# Patient Record
Sex: Male | Born: 1980 | Race: White | Hispanic: No | Marital: Married | Smoking: Current some day smoker
Health system: Southern US, Community
[De-identification: ages and names within clinical notes are randomized; demographics above are authoritative.]

## PROBLEM LIST (undated history)

## (undated) DIAGNOSIS — M549 Dorsalgia, unspecified: Secondary | ICD-10-CM

## (undated) HISTORY — PX: LUMBAR SPINE SURGERY: SHX701

---

## 2000-08-16 ENCOUNTER — Emergency Department (HOSPITAL_COMMUNITY): Admission: EM | Admit: 2000-08-16 | Discharge: 2000-08-16 | Payer: Self-pay | Admitting: Emergency Medicine

## 2006-08-16 ENCOUNTER — Emergency Department: Payer: Self-pay

## 2007-10-21 ENCOUNTER — Emergency Department: Payer: Self-pay | Admitting: Emergency Medicine

## 2007-12-10 ENCOUNTER — Emergency Department: Payer: Self-pay | Admitting: Emergency Medicine

## 2007-12-15 ENCOUNTER — Ambulatory Visit: Payer: Self-pay | Admitting: Family Medicine

## 2008-09-22 ENCOUNTER — Emergency Department (HOSPITAL_COMMUNITY): Admission: EM | Admit: 2008-09-22 | Discharge: 2008-09-22 | Payer: Self-pay | Admitting: Emergency Medicine

## 2008-09-24 ENCOUNTER — Emergency Department (HOSPITAL_COMMUNITY): Admission: EM | Admit: 2008-09-24 | Discharge: 2008-09-24 | Payer: Self-pay | Admitting: *Deleted

## 2008-09-25 ENCOUNTER — Ambulatory Visit (HOSPITAL_COMMUNITY): Admission: RE | Admit: 2008-09-25 | Discharge: 2008-09-25 | Payer: Self-pay | Admitting: Specialist

## 2008-10-04 ENCOUNTER — Inpatient Hospital Stay (HOSPITAL_COMMUNITY): Admission: RE | Admit: 2008-10-04 | Discharge: 2008-10-06 | Payer: Self-pay | Admitting: Specialist

## 2008-10-04 ENCOUNTER — Encounter (INDEPENDENT_AMBULATORY_CARE_PROVIDER_SITE_OTHER): Payer: Self-pay | Admitting: Specialist

## 2008-10-31 ENCOUNTER — Emergency Department (HOSPITAL_COMMUNITY): Admission: EM | Admit: 2008-10-31 | Discharge: 2008-11-01 | Payer: Self-pay | Admitting: Emergency Medicine

## 2008-11-28 ENCOUNTER — Emergency Department: Payer: Self-pay | Admitting: Emergency Medicine

## 2008-12-05 ENCOUNTER — Emergency Department (HOSPITAL_COMMUNITY): Admission: EM | Admit: 2008-12-05 | Discharge: 2008-12-06 | Payer: Self-pay | Admitting: Emergency Medicine

## 2009-04-21 ENCOUNTER — Emergency Department (HOSPITAL_COMMUNITY): Admission: EM | Admit: 2009-04-21 | Discharge: 2009-04-21 | Payer: Self-pay | Admitting: Emergency Medicine

## 2009-05-04 ENCOUNTER — Emergency Department: Payer: Self-pay | Admitting: Emergency Medicine

## 2009-05-09 ENCOUNTER — Emergency Department: Payer: Self-pay | Admitting: Emergency Medicine

## 2009-05-10 ENCOUNTER — Emergency Department (HOSPITAL_COMMUNITY): Admission: EM | Admit: 2009-05-10 | Discharge: 2009-05-10 | Payer: Self-pay | Admitting: Emergency Medicine

## 2009-11-14 ENCOUNTER — Emergency Department (HOSPITAL_COMMUNITY): Admission: EM | Admit: 2009-11-14 | Discharge: 2009-11-14 | Payer: Self-pay | Admitting: Emergency Medicine

## 2009-11-28 ENCOUNTER — Emergency Department (HOSPITAL_COMMUNITY): Admission: EM | Admit: 2009-11-28 | Discharge: 2009-11-28 | Payer: Self-pay | Admitting: Emergency Medicine

## 2009-12-01 ENCOUNTER — Emergency Department: Payer: Self-pay | Admitting: Emergency Medicine

## 2009-12-27 ENCOUNTER — Emergency Department: Payer: Self-pay | Admitting: Emergency Medicine

## 2010-01-04 ENCOUNTER — Emergency Department: Payer: Self-pay | Admitting: Emergency Medicine

## 2010-01-23 ENCOUNTER — Emergency Department: Payer: Self-pay | Admitting: Emergency Medicine

## 2010-01-23 ENCOUNTER — Emergency Department (HOSPITAL_COMMUNITY): Admission: EM | Admit: 2010-01-23 | Discharge: 2010-01-23 | Payer: Self-pay | Admitting: Emergency Medicine

## 2010-01-25 ENCOUNTER — Emergency Department: Payer: Self-pay | Admitting: Emergency Medicine

## 2010-02-02 ENCOUNTER — Emergency Department: Payer: Self-pay | Admitting: Emergency Medicine

## 2010-04-26 ENCOUNTER — Emergency Department (HOSPITAL_COMMUNITY): Admission: EM | Admit: 2010-04-26 | Discharge: 2010-04-26 | Payer: Self-pay | Admitting: Emergency Medicine

## 2010-05-04 ENCOUNTER — Emergency Department: Payer: Self-pay | Admitting: Emergency Medicine

## 2010-06-30 ENCOUNTER — Emergency Department: Payer: Self-pay | Admitting: Emergency Medicine

## 2010-10-10 LAB — APTT: aPTT: 36 seconds (ref 24–37)

## 2010-10-10 LAB — CBC: Hemoglobin: 14.4 g/dL (ref 13.0–17.0)

## 2010-10-10 LAB — DIFFERENTIAL
Basophils Absolute: 0 10*3/uL (ref 0.0–0.1)
Eosinophils Absolute: 0.2 10*3/uL (ref 0.0–0.7)
Eosinophils Relative: 4 % (ref 0–5)
Monocytes Absolute: 0.5 10*3/uL (ref 0.1–1.0)
Neutro Abs: 2.2 10*3/uL (ref 1.7–7.7)

## 2010-10-10 LAB — BASIC METABOLIC PANEL
BUN: 11 mg/dL (ref 6–23)
CO2: 29 mEq/L (ref 19–32)
Creatinine, Ser: 0.84 mg/dL (ref 0.4–1.5)
GFR calc Af Amer: >60 mL/min (ref >60)

## 2010-10-10 LAB — PROTIME-INR: INR: 0.9 (ref 0.00–1.49)

## 2010-11-13 NOTE — Op Note (Signed)
NAMEDELVONTE, BERENSON                ACCOUNT NO.:  [id number]   MEDICAL RECORD NO.:  [id number]          PATIENT TYPE:  INP   LOCATION:  5001                         FACILITY:  [facility]   PHYSICIAN:  Kerrin Champagne, M.D.   DATE OF BIRTH:  Feb 04, 1981   DATE OF PROCEDURE:  10/04/2008  DATE OF DISCHARGE:                               OPERATIVE REPORT   PREOPERATIVE DIAGNOSIS:  Herniated nucleus pulposus, central L4-5 and  herniated nucleus pulposus, central right-sided L5-S1.   POSTOPERATIVE DIAGNOSIS:  Herniated nucleus pulposus, central L4-5 and  herniated nucleus pulposus, central right-sided L5-S1.   PROCEDURE:  Bilateral approach to microdiskectomy, L4-5 and L5-S1 with  excision of herniated nucleus pulposus primarily right-sided at L4-5 and  L5-S1.   SURGEON:  Kerrin Champagne, MD   ASSISTANT:  Wende Neighbors, PA-C   ANESTHESIA:  General via orotracheal intubation.   FINDINGS:  Large HNP, L4-5 centrally, thecal sac and nerve roots were  lifted posteriorly over the posterior aspect of spinal canal.  At the L5-  S1 midline HNP with extension to the right side, epidural veins  tethering the S1 nerve root over the disk protrusion.   Foley catheter was discontinued at the end of the case.   ESTIMATED BLOOD LOSS:  50 mL.   COMPLICATIONS:  None.   The patient was sent to the PACU in good condition.   HISTORY OF PRESENT ILLNESS:  The patient is a 30 year old male with  history of low back pain going on for years, more recently worsening to  the point, he was having difficulty standing for any length of time or  walking for any length of time.  The patient was seen in the office last  week after being seen in the emergency room 1 week prior for severe back  pain, radiation to the both legs and buttocks.  Underwent MRI study.  Results of the MRI study indicates central disk herniations of L4-5 and  L5-S1.  The largest disk protrusions at L4-5 with severe compression of  the thecal  sac and nerve roots at this level.  The patient's clinical  exam showed severe sciatic tension signs x3.  He describes his pain as  being constant, requiring narcotic medicines to relieve discomfort in  any position as it was sitting, lying, or standing.  He was brought to  the operating room to undergo microdiskectomy, bilateral approach to the  L4-5 level, decompression bilaterally if necessary.  His pain is right  sided of the leg, so that approach through the right side will be  performed first and if unable to fully decompress, then left-sided disk  excision would also be performed.   DESCRIPTION OF PROCEDURE:  This patient was seen in preoperative holding  area and has had areas for expected surgery marked along the right side,  L4-5, L5-S1.  In the operating room, he was placed under general  anesthetic, had Foley catheter placed under preoperative antibiotics of  Ancef.  Roll to a prone position, a Wilson frame was used, Academic librarian.  All pressure points were well padded, PAS  stockings.   Initial incision approximately 4.5 cm in length after first placing  spinal needles and then performing lateral radiograph in the operating  room demonstrating the needles on either side of the L5 spinous process.  Incision was made between the needles after infiltration of Marcaine  0.5% with 1:200,000 epinephrine.  Incision approximately 4.5 cm in  length through the skin and subcutaneous layers down to the lumbodorsal  fascia, this was then incised on either side of spinous process.  The  supraspinous ligaments continued along the lateral aspects of the  spinous processes of L4, L5, S1, and of L3.  Clamps were placed at the  expected L4-5 and L5-S1 interspinous processes.  Intraoperative lateral  radiographs demonstrated the lower clamp at the L4-5 level, the upper at  the L5-S1 level so that further exposure was continued downwards, an  additional 2 cm.  Cobbs used to elevate the  paralumbar muscles off the  posterior elements and this was continued laterally.  Bleeders  controlled using electrocautery.  McCulloch retractor was then inserted.  Each of the areas were clamped in place, were marked with a single  suture.  The single suture at the lowest level indicating the space  between this L4 and L5 spinous processes of the next interspinous space  was L5-S1 inferiorly.  This was verified with the identification of the  sacrum.  McCulloch retractor was then adjusted and Leksell rongeur was  used to remove the small portion of the inferior aspect of lamina  bilaterally at L4 and bilaterally at L5.  High-speed bur was then used  to remove further small portion of the inferior aspect of lamina at L4  bilaterally and L5 bilaterally, performed partial medial fasciectomy  involving the medial aspect of the inferior articular process of L4  bilaterally and L5 bilaterally about 10-15% of the joint.  Curette freed  the ligamentum flavum off the superior aspect of the lamina of S1 and L5  bilaterally and resection of the inferior aspect of the lamina  bilaterally at the L4 and L5 levels carried out up to the insertion of  ligamentum flavum where this was then detached using a nerve hook and 2  or 3 cm Kerrison.  Ligamentum flavum was resected bilaterally at the L4-  5 level, L5-S1 this was done using loop magnification.  Bleeders  controlled using bipolar electrocautery.  Operating room microscope was  sterilely draped and brought into the field.  Under the operating room  microscope, L4-5 level was evaluated.  Disk herniation was large in the  central portion of the disk, so that the thecal sac and the spinal nerve  roots were displaced posteriorly.  Disk herniation extended the lateral  recesses, the disk portion of the lateral recess up to just beyond the  posterior aspect of the superior articular process of L5 bilaterally.  The disk that was to be excised on the right  side, foraminotomy was  first performed over both L5 nerve roots freeing these areas up nicely.  Attention turned to the right side where the thecal sac was carefully  retracted medially, the disk herniation identified and a nerve hook,  then pituitaries were then used to carefully tease large amount of disk  material from the central herniation site and 3 large fragments were  removed, the first fragment about 5 or 6 inches in length x 0.5 inch x  0.25 inch, these were readily enough disk herniation material to explain  the mass effect seen on  the MRI study.  Following this, then the  posterior longitudinal ligaments was noted to be quite floppy and was  left intact.  Epstein curette was used to carefully debride further  annular material along the right side and micro pituitaries as well as  regular pituitaries were used excise any of this annular material that  was debrided.  Disk space entered and further disk material was excised,  it was loose and fragmented using the micro straight pituitaries.  The  reflected portion of ligamentum flavum was resected bilaterally at L4  and L5.  Neuroforamen for the L4 nerve roots were probed using Kellogg neuro probe, probe free bilaterally, as did the foramen for the L5  nerve roots. Thrombin-soaked Gelfoam was then placed over the laminotomy  sites bilaterally and attention turned to the L5-S1 level.  Note that  examination was carried out from the left side, the side opposite of the  disk excision and the L5 nerve root retracted medially as was the thecal  sac and the disk posteriorly examined.  Disk was found to be somewhat  patulous and showed no further disk herniation present in the  subligamentous area and it was felt that this posterior longitudinal  ligament is somewhat loose due to decompression of underlying disk here.  No further disk material was found at this point.  Small amount of  epidural veins were cauterized using bipolar  electrocautery.  Thrombin-  soaked Gelfoam placed on the left as well as right side of L4-5, and  again, attention turned to the next segment at L5-S1.  Here on the right  side, the thecal sac and L5 nerve root were carefully freed up,  retracting medially.  Penfield 4 used the tease the epidural veins  medially identifying the L5-S1 disk space and protruded disk area.  A 15  blade scalpel then used to incise the disk using operative microscope  longitudinally and pituitary rongeurs used to debride the disk of  degenerative disk material.  A bulbous-tip nerve hook then used to probe  the disk space.  Epstein curettes were then used to further debride the  annular material posterior to the posterior aspect of the disk space.  This was then excised using pituitary rongeurs until no further  fragments were found to be free within the disk area.  Hockey stick  neuro probe passed out both neural foramina, both right and left side  indicating decompression of the S1 nerve roots on both sides.  The right  S1 nerve root was carefully freed up further by cauterizing epidural  veins as a leash keeping the neuroforamen being able to retract  medially.  These were divided with 15 blade scalpel.  Following  hemostasis and irrigation, all excess Gelfoam was then removed in its  entirety through midline with interrupted #1 Vicryl sutures.  Deep subcu  layers reapproximated with interrupted #1 and 0 Vicryl sutures.  More  superficial layers with interrupted 2-0 Vicryl sutures and skin closed  with a running subcu stitch of 4-0 Vicryl.  The patient then had  Dermabond applied.  A 4 x 4's fixed to the skin with Hypafix tape.  The  patient then returned to the supine position.  He had Foley catheter  removed.  He was reactivated, extubated, and returned to recovery room  in satisfactory condition.      Kerrin Champagne, M.D.  Electronically Signed     JEN/MEDQ  D:  10/04/2008  T:  10/05/2008  Job:  [id number] 

## 2010-11-16 NOTE — Discharge Summary (Signed)
Alexander Morgan, Alexander Morgan                ACCOUNT NO.:  [id number]   MEDICAL RECORD NO.:  [id number]          PATIENT TYPE:  INP   LOCATION:  5001                         FACILITY:  [facility]   PHYSICIAN:  Kerrin Champagne, M.D.   DATE OF BIRTH:  June 02, 1981   DATE OF ADMISSION:  10/04/2008  DATE OF DISCHARGE:  10/06/2008                               DISCHARGE SUMMARY   ADMISSION DIAGNOSES:  1. Herniated nucleus pulposus, central L4-L5 and right-sided L5-S1.  2. Hypertension.  3. Depression.   DISCHARGE DIAGNOSES:  1. Herniated nucleus pulposus, central L4-L5 and right-sided L5-S1.  2. Hypertension.  3. Depression.   PROCEDURE:  On October 04, 2008, the patient underwent bilateral approach  to microdiskectomy at L4-L5 and L5-S1 with excision of herniated nucleus  pulposus, primarily right-sided at L4-L5 and L5-S1.  This was performed  by Dr. Otelia Sergeant assisted by Maud Deed, [facility] under general anesthesia.   CONSULTATIONS:  None.   BRIEF HISTORY:  The patient is a 30 year old male with increasing pain  in his low back with difficulty standing for periods of time and  walking.  He has required emergency room visits previously for severe  back pain radiating into both legs and buttocks.  MRI study indicated  central disk herniations at L4-L5 and L5-S1.  The largest disk  protrusion was at the L4-L5 level with severe compression of the thecal  sac and nerve roots.  Clinical exam showed severe sciatic tension signs  x3.  He is requiring narcotic analgesics for his discomfort.  It was  felt that he would benefit from surgical intervention and was admitted  for the procedure as stated above.   BRIEF HOSPITAL COURSE:  The patient tolerated the procedure under  general anesthesia without complications.  Postoperatively,  neurovascular motor function of the lower extremities was intact.  The  patient had considerable discomfort and required increasing of his  analgesics.  He had difficulty with  independent ambulation and therefore  physical therapy and occupational therapy consults were obtained and he  was able to ambulate short distances in the hallway.  The patient also  had mild headache on the first postoperative day.  Neurovascular and  motor function of the lower extremities was intact.  He was afebrile and  vital signs were stable.  The patient was able to void without  difficulty.  On October 06, 2008, he was able to be discharged to his home.  Dressing change showed the wound to be healing without drainage.   PERTINENT LABORATORY VALUES:  Admission labs include CBC, coagulation  study, and BMET which all were within normal limits.   PLAN:  The patient was discharged to home with instructions to change  his dressing daily and given Tegaderm dressings to do so.  He will  continue ambulating as tolerated.  He will avoid bending, lifting, or  twisting.  The patient is instructed to follow up with Dr. Otelia Sergeant 2 weeks  from the date of surgery.  Arrangements will be made by phoning [phone].   DISCHARGE MEDICATIONS:  1. OxyContin 20 mg one q.12 h.  2. Percocet 5/325 one to two every 4-6 hours as needed for pain.  3. Robaxin 500 mg one every 8 hours as needed for spasm.  4. Lisinopril 20 mg daily.  5. Hydrochlorothiazide 25 mg daily.   All questions encouraged and answered.   CONDITION ON DISCHARGE:  Stable.      Wende Neighbors, P.A.      Kerrin Champagne, M.D.  Electronically Signed    SMV/MEDQ  D:  11/17/2008  T:  11/18/2008  Job:  [id number]

## 2011-01-18 ENCOUNTER — Emergency Department (HOSPITAL_COMMUNITY)
Admission: EM | Admit: 2011-01-18 | Discharge: 2011-01-18 | Disposition: A | Discharge status: Home / Self Care | Payer: Self-pay | Attending: Emergency Medicine | Admitting: Emergency Medicine

## 2011-01-18 DIAGNOSIS — M545 Low back pain, unspecified: Secondary | ICD-10-CM | POA: Insufficient documentation

## 2011-01-18 HISTORY — DX: Dorsalgia, unspecified: M54.9

## 2011-01-18 MED ORDER — OXYCODONE-ACETAMINOPHEN 10-325 MG PO TABS: 1.0000 | ORAL_TABLET | ORAL | Status: DC | PRN

## 2011-01-18 MED ORDER — KETOROLAC TROMETHAMINE 60 MG/2ML IM SOLN
60.0000 mg | Freq: Once | INTRAMUSCULAR | Status: AC
  Administered 2011-01-18: 60 mg via INTRAMUSCULAR
  Filled 2011-01-18: qty 2

## 2011-01-18 MED ORDER — MORPHINE SULFATE 4 MG/ML IJ SOLN: 4.0000 mg | Freq: Once | INTRAMUSCULAR | Status: DC

## 2011-01-18 MED ORDER — MORPHINE SULFATE 10 MG/ML IJ SOLN
10.0000 mg | Freq: Once | INTRAMUSCULAR | Status: AC
  Administered 2011-01-18: 10 mg via INTRAMUSCULAR
  Filled 2011-01-18: qty 1

## 2011-01-18 NOTE — ED Notes (Signed)
Pt presents with low back pain. Pt has hx of ruptured disc in L4&L5. Pt due to have surgery but is out of pain medication. PMD instructed pt to come to ED for pain medication"to get him though" per pt.

## 2011-01-18 NOTE — ED Notes (Signed)
Pt reports damage to his L4 & L5.  States that he has already had surgery once but is scheduled to have another soon.  Pt states that his dr gave him 87 percocet to help with the pain before surgery but he has ran out.  He has an appt set for Wednesday, but states that he cannot make it that long.  nad noted

## 2011-01-18 NOTE — ED Provider Notes (Signed)
History     Chief Complaint  Patient presents with  . Back Pain   HPI Comments: Alexander Morgan 30 y.o. White male with history of chronic back problems. He has had surgery previously for lower back and approx. 3 weeks ago had severe back pain and he saw his orthopedic surgeon and had MRI that showed ruptured disc at L4-5. He is scheduled for surgery in 3 weeks but here today with pain that is not controlled with his current medication. States he is having severe pain in the left side of his back going down his left leg with some numbness.       The history is provided by the patient.    Past Medical History  Diagnosis Date  . Back pain     Past Surgical History  Procedure Date  . Lumbar spine surgery     History reviewed. No pertinent family history.  History  Substance Use Topics  . Smoking status: Never Smoker   . Smokeless tobacco: Not on file  . Alcohol Use: No      Review of Systems  Constitutional: Negative for fever, chills, diaphoresis and fatigue.  HENT: Negative for ear pain, congestion, sore throat, facial swelling, neck pain, neck stiffness, dental problem and sinus pressure.   Eyes: Negative for photophobia, pain and discharge.  Respiratory: Negative for cough, chest tightness and wheezing.   Gastrointestinal: Negative for nausea, vomiting, abdominal pain, diarrhea, constipation and abdominal distention.  Genitourinary: Negative for dysuria, frequency, flank pain and difficulty urinating.  Musculoskeletal: Positive for back pain and gait problem. Negative for myalgias.  Skin: Negative for color change and rash.  Neurological: Positive for weakness and numbness. Negative for dizziness, speech difficulty, light-headedness and headaches.  Psychiatric/Behavioral: Negative for confusion and agitation.    Physical Exam  BP 127/81  Pulse 108  Temp(Src) 97.7 F (36.5 C) (Oral)  Resp 16  SpO2 99%  Physical Exam  Nursing note and vitals  reviewed. Constitutional: He is oriented to person, place, and time. He appears well-developed and well-nourished.  HENT:  Head: Normocephalic and atraumatic.  Neck: Neck supple.  Pulmonary/Chest: Effort normal. No respiratory distress.  Abdominal: Soft. There is no tenderness.  Musculoskeletal: He exhibits no edema.       Lumbar back: He exhibits decreased range of motion, tenderness, bony tenderness and spasm.       Pain with straight leg raises, more on left than right  Neurological: He is alert and oriented to person, place, and time. He has normal reflexes. No cranial nerve deficit.  Skin: Skin is warm and dry. No bruising, no ecchymosis and no rash noted.  Psychiatric: He has a normal mood and affect.    ED Course  Procedures  MDM ABDULHAMID OLGIN has has a history chronic back pain and scheduled for surgery.  Dx of ruptured disc L 4 -5. Scheduled for surgery. Will treat for pain until he can follow up with Dr. Otelia Sergeant.        [place], [place] 01/18/11 [phone]

## 2011-01-18 NOTE — ED Notes (Signed)
No reaction from injections.

## 2011-01-18 NOTE — ED Notes (Signed)
PA at bedside.

## 2011-01-19 NOTE — ED Provider Notes (Signed)
Medical screening examination/treatment/procedure(s) were performed by non-physician practitioner and as supervising physician I was immediately available for consultation/collaboration. Devoria Albe, MD, FACEP   Ward Givens, MD 01/19/11 [phone]

## 2011-04-10 ENCOUNTER — Emergency Department (HOSPITAL_COMMUNITY)
Admission: EM | Admit: 2011-04-10 | Discharge: 2011-04-10 | Disposition: A | Discharge status: Home / Self Care | Payer: Self-pay | Attending: Emergency Medicine | Admitting: Emergency Medicine

## 2011-04-10 DIAGNOSIS — R209 Unspecified disturbances of skin sensation: Secondary | ICD-10-CM | POA: Insufficient documentation

## 2011-04-10 DIAGNOSIS — M545 Low back pain, unspecified: Secondary | ICD-10-CM | POA: Insufficient documentation

## 2011-04-10 DIAGNOSIS — R292 Abnormal reflex: Secondary | ICD-10-CM | POA: Insufficient documentation

## 2011-04-10 DIAGNOSIS — Z79899 Other long term (current) drug therapy: Secondary | ICD-10-CM | POA: Insufficient documentation

## 2011-04-10 DIAGNOSIS — G8929 Other chronic pain: Secondary | ICD-10-CM | POA: Insufficient documentation

## 2011-04-10 DIAGNOSIS — I10 Essential (primary) hypertension: Secondary | ICD-10-CM | POA: Insufficient documentation

## 2011-04-26 ENCOUNTER — Emergency Department (HOSPITAL_COMMUNITY)
Admission: EM | Admit: 2011-04-26 | Discharge: 2011-04-26 | Discharge status: Against Medical Advice | Payer: Self-pay | Attending: Emergency Medicine | Admitting: Emergency Medicine

## 2011-04-26 ENCOUNTER — Encounter (HOSPITAL_COMMUNITY): Payer: Self-pay

## 2011-04-26 DIAGNOSIS — M549 Dorsalgia, unspecified: Secondary | ICD-10-CM | POA: Insufficient documentation

## 2011-04-26 DIAGNOSIS — Z5320 Procedure and treatment not carried out because of patient's decision for unspecified reasons: Secondary | ICD-10-CM | POA: Insufficient documentation

## 2011-04-26 NOTE — ED Notes (Signed)
Pt states that he has chronic back problems, states that he has progressively gotten worse now to the point that the pain affects his adl's at home, pt denies any new injury, states that he contacted his neurosurgeon today who advised pt to come to local er due to not having enough money to see dr today, pt states that he is waiting for his medicaid to get "come through" in a week or two.

## 2011-04-26 NOTE — ED Notes (Signed)
Pt states that once he takes the car to his aunt that he will return to er for tx.

## 2011-04-26 NOTE — ED Notes (Signed)
Pt presents with low back pain. Pt states he has ruptured discs in back and does not have insurance to get surgery that is needed. Pt states pain is increasing and is unable to put shoes on. Pt states he has had pain for over 3 years. Pt to triage via w/c.

## 2011-04-26 NOTE — ED Notes (Signed)
Pt states that he is not able to wait anylonger, pt states that they borrowed a car to bring them to the er and that they have to have the car back by three today, asked pt if he could stay and have someone pick him up pt states that no he could not. Ama risks explained to pt and family, ama signed by pt

## 2011-05-09 NOTE — ED Provider Notes (Signed)
History     CSN: [id number] Arrival date & time: 04/26/2011 12:22 PM   First MD Initiated Contact with Patient 04/26/11 1432      Chief Complaint  Patient presents with  . Back Pain    (Consider location/radiation/quality/duration/timing/severity/associated sxs/prior treatment) HPI  Past Medical History  Diagnosis Date  . Back pain     Past Surgical History  Procedure Date  . Lumbar spine surgery     History reviewed. No pertinent family history.  History  Substance Use Topics  . Smoking status: Never Smoker   . Smokeless tobacco: Not on file  . Alcohol Use: No      Review of Systems  Allergies  Review of patient's allergies indicates no known allergies.  Home Medications   Current Outpatient Rx  Name Route Sig Dispense Refill  . DIAZEPAM 5 MG PO TABS Oral Take 10 mg by mouth at bedtime as needed.      Marland Kitchen LISINOPRIL-HYDROCHLOROTHIAZIDE 20-12.5 MG PO TABS Oral Take 1 tablet by mouth daily.      . OXYCODONE-ACETAMINOPHEN 10-325 MG PO TABS Oral Take 1 tablet by mouth every 4 (four) hours as needed. 30 tablet 0    BP 144/87  Pulse 86  Temp(Src) 98.3 F (36.8 C) (Oral)  Resp 20  Ht 5\' 7"  (1.702 m)  Wt 220 lb (99.791 kg)  BMI 34.46 kg/m2  SpO2 100%  Physical Exam  ED Course  Procedures (including critical care time)  Labs Reviewed - No data to display No results found.   No diagnosis found.    MDM          Worthy Rancher, PA 05/09/11 [phone]

## 2011-05-10 NOTE — ED Provider Notes (Signed)
Medical screening examination/treatment/procedure(s) were performed by non-physician practitioner and as supervising physician I was immediately available for consultation/collaboration.   Phillis Thackeray M Madelin Weseman, DO 05/10/11 1201 

## 2012-01-28 ENCOUNTER — Encounter (HOSPITAL_COMMUNITY): Payer: Self-pay | Admitting: *Deleted

## 2012-01-28 ENCOUNTER — Emergency Department (HOSPITAL_COMMUNITY): Payer: Self-pay

## 2012-01-28 ENCOUNTER — Emergency Department (HOSPITAL_COMMUNITY)
Admission: EM | Admit: 2012-01-28 | Discharge: 2012-01-28 | Disposition: A | Discharge status: Home / Self Care | Payer: Self-pay | Attending: Emergency Medicine | Admitting: Emergency Medicine

## 2012-01-28 DIAGNOSIS — R109 Unspecified abdominal pain: Secondary | ICD-10-CM | POA: Insufficient documentation

## 2012-01-28 DIAGNOSIS — N200 Calculus of kidney: Secondary | ICD-10-CM | POA: Insufficient documentation

## 2012-01-28 LAB — COMPREHENSIVE METABOLIC PANEL
Albumin: 5 g/dL (ref 3.5–5.2)
Alkaline Phosphatase: 95 U/L (ref 39–117)
Chloride: 102 mEq/L (ref 96–112)
GFR calc Af Amer: >90 mL/min (ref >90)
GFR calc non Af Amer: >90 mL/min (ref >90)
Potassium: 3.8 mEq/L (ref 3.5–5.1)
Sodium: 140 mEq/L (ref 135–145)
Total Bilirubin: 0.7 mg/dL (ref 0.3–1.2)
Total Protein: 8.6 g/dL — ABNORMAL HIGH (ref 6.0–8.3)

## 2012-01-28 LAB — CBC WITH DIFFERENTIAL/PLATELET
Basophils Absolute: 0 10*3/uL (ref 0.0–0.1)
Basophils Relative: 1 % (ref 0–1)
Eosinophils Absolute: 0.2 10*3/uL (ref 0.0–0.7)
Lymphs Abs: 2.8 10*3/uL (ref 0.7–4.0)
Monocytes Absolute: 0.7 10*3/uL (ref 0.1–1.0)
Monocytes Relative: 10 % (ref 3–12)
Neutro Abs: 3.5 10*3/uL (ref 1.7–7.7)
Neutrophils Relative %: 48 % (ref 43–77)
RBC: 5.25 MIL/uL (ref 4.22–5.81)
RDW: 13.4 % (ref 11.5–15.5)
WBC: 7.4 10*3/uL (ref 4.0–10.5)

## 2012-01-28 MED ORDER — ONDANSETRON HCL 4 MG/2ML IJ SOLN
4.0000 mg | Freq: Once | INTRAMUSCULAR | Status: AC
  Administered 2012-01-28: 4 mg via INTRAVENOUS
  Filled 2012-01-28: qty 2

## 2012-01-28 MED ORDER — IOHEXOL 300 MG/ML  SOLN
100.0000 mL | Freq: Once | INTRAMUSCULAR | Status: AC | PRN
  Administered 2012-01-28: 100 mL via INTRAVENOUS

## 2012-01-28 MED ORDER — HYDROCODONE-ACETAMINOPHEN 5-325 MG PO TABS: 1.0000 | ORAL_TABLET | Freq: Four times a day (QID) | ORAL | Status: AC | PRN

## 2012-01-28 MED ORDER — HYDROMORPHONE HCL PF 1 MG/ML IJ SOLN
1.0000 mg | Freq: Once | INTRAMUSCULAR | Status: AC
  Administered 2012-01-28: 1 mg via INTRAVENOUS
  Filled 2012-01-28: qty 1

## 2012-01-28 NOTE — ED Notes (Signed)
Discharge instructions reviewed with pt, questions answered. Pt verbalized understanding.  

## 2012-01-28 NOTE — ED Notes (Signed)
Pt back from CT
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
## 2013-05-18 ENCOUNTER — Emergency Department (HOSPITAL_COMMUNITY)
Admission: EM | Admit: 2013-05-18 | Discharge: 2013-05-18 | Disposition: A | Discharge status: Home / Self Care | Payer: Self-pay | Attending: Emergency Medicine | Admitting: Emergency Medicine

## 2013-05-18 ENCOUNTER — Encounter (HOSPITAL_COMMUNITY): Payer: Self-pay | Admitting: Emergency Medicine

## 2013-05-18 DIAGNOSIS — G8929 Other chronic pain: Secondary | ICD-10-CM | POA: Insufficient documentation

## 2013-05-18 DIAGNOSIS — M545 Low back pain, unspecified: Secondary | ICD-10-CM | POA: Insufficient documentation

## 2013-05-18 DIAGNOSIS — M549 Dorsalgia, unspecified: Secondary | ICD-10-CM

## 2013-05-18 DIAGNOSIS — Z9889 Other specified postprocedural states: Secondary | ICD-10-CM | POA: Insufficient documentation

## 2013-05-18 DIAGNOSIS — Z79899 Other long term (current) drug therapy: Secondary | ICD-10-CM | POA: Insufficient documentation

## 2013-05-18 MED ORDER — METHOCARBAMOL 500 MG PO TABS: 1000.0000 mg | ORAL_TABLET | Freq: Four times a day (QID) | ORAL | Status: DC

## 2013-05-18 MED ORDER — OXYCODONE-ACETAMINOPHEN 5-325 MG PO TABS: 1.0000 | ORAL_TABLET | Freq: Four times a day (QID) | ORAL | Status: DC | PRN
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
## 2013-05-18 NOTE — ED Provider Notes (Signed)
CSN: [id number]     Arrival date & time 05/18/13  1122 History  This chart was scribed for Renne Crigler, PA working with Shelda Jakes, MD by Quintella Reichert, ED Scribe. This patient was seen in room TR07C/TR07C and the patient's care was started at 11:36 AM.   Chief Complaint  Patient presents with  . Back Pain    The history is provided by the patient and medical records. No language interpreter was used.    HPI Comments: Alexander Morgan is a 32 y.o. male who presents to the Emergency Department complaining of exacerbation of his chronic back pain.  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  He states he had been doing well until the past several weeks during which his pain has been flaring up again.  He notes that he has been chopping wood recently.  Presently he complains of severe lower left-sided back pain that radiates down the back of his left leg.  He has been taking Goody Powder without relief.  He normally takes oxycodone and Valium which is effective for him, but he has been out for 2 months because he has not seen his pain management specialist recently.  He has an appointment on 11/28 to do this as well as to discuss another surgery if needed.  Pt denies bowel or bladder issues, fevers, unexpected weight loss.  He denies IV drug use.  According to [facility] Substance Reporting Database, last narcotics prescription filled on 04/28/13.  He received 120 tablets Percocet 10-325 at that time.   Past Medical History  Diagnosis Date  . Back pain     Past Surgical History  Procedure Laterality Date  . Lumbar spine surgery      No family history on file.   History  Substance Use Topics  . Smoking status: Never Smoker   . Smokeless tobacco: Not on file  . Alcohol Use: Yes     Review of Systems  Constitutional: Negative for fever and unexpected weight change.  Gastrointestinal: Negative for diarrhea and constipation.       Neg for fecal  incontinence  Genitourinary: Negative for dysuria, urgency, frequency, hematuria, flank pain, decreased urine volume and difficulty urinating.       Negative for urinary incontinence or retention  Musculoskeletal: Positive for back pain.  Neurological: Negative for weakness and numbness.       Negative for saddle paresthesias      Allergies  Mobic and Naproxen  Home Medications   Current Outpatient Rx  Name  Route  Sig  Dispense  Refill  . Aspirin-Acetaminophen 500-325 MG PACK   Oral   Take 3 packets by mouth 3 (three) times daily as needed (pain).         . methocarbamol (ROBAXIN) 500 MG tablet   Oral   Take 2 tablets (1,000 mg total) by mouth 4 (four) times daily.   20 tablet   0   . oxyCODONE-acetaminophen (PERCOCET/ROXICET) 5-325 MG per tablet   Oral   Take 1-2 tablets by mouth every 6 (six) hours as needed for severe pain.   8 tablet   0     BP 135/90  Pulse 91  Resp 20  SpO2 100%  Physical Exam  Nursing note and vitals reviewed. Constitutional: He appears well-developed and well-nourished. No distress.  HENT:  Head: Normocephalic and atraumatic.  Eyes: Conjunctivae and EOM are normal.  Neck: Normal range of motion. Neck supple. No tracheal  deviation present.  Cardiovascular: Normal rate.   Pulmonary/Chest: Effort normal. No respiratory distress.  Abdominal: Soft. There is no tenderness. There is no CVA tenderness.  Musculoskeletal: Normal range of motion. He exhibits no tenderness.  Left lumbar paraspinous tenderness and spasm  Neurological: He is alert. He has normal reflexes. No sensory deficit. He exhibits normal muscle tone.  5/5 strength in entire lower extremities bilaterally. No sensation deficit.   Skin: Skin is warm and dry.  Psychiatric: He has a normal mood and affect. His behavior is normal.    ED Course  Procedures (including critical care time)  DIAGNOSTIC STUDIES: Oxygen Saturation is 100% on room air, normal by my interpretation.     COORDINATION OF CARE: 11:48 AM-Discussed treatment plan which includes pain medication and muscle relaxants and f/u with specialist with pt at bedside and pt agreed to plan.    Vital signs reviewed and are as follows: Filed Vitals:   05/18/13 1157  BP:   Pulse:   Temp: 98.6 F (37 C)  Resp:   BP 135/90  Pulse 91  Temp(Src) 98.6 F (37 C) (Oral)  Resp 20  SpO2 100%   No red flag s/s of low back pain. Patient was counseled on back pain precautions and told to do activity as tolerated but do not lift, push, or pull heavy objects more than 10 pounds for the next week.  Patient counseled to use ice or heat on back for no longer than 15 minutes every hour.   Patient prescribed muscle relaxer and counseled on proper use of muscle relaxant medication.    Patient prescribed narcotic pain medicine and counseled on proper use of narcotic pain medications. Counseled not to combine this medication with others containing tylenol.   Urged patient not to drink alcohol, drive, or perform any other activities that requires focus while taking either of these medications.  Patient urged to follow-up with PCP if pain does not improve with treatment and rest or if pain becomes recurrent. Urged to return with worsening severe pain, loss of bowel or bladder control, trouble walking.   The patient verbalizes understanding and agrees with the plan.      Labs Review Labs Reviewed - No data to display  Imaging Review No results found.  EKG Interpretation   None       MDM   1. Back pain    Patient with back pain. Suspect he is misusing his medications. He should still have percocet from last rx. No neurological deficits. Patient is ambulatory. No warning symptoms of back pain including: loss of bowel or bladder control, night sweats, waking from sleep with back pain, unexplained fevers or weight loss, h/o cancer, IVDU, recent trauma. No concern for cauda equina, epidural abscess, or other  serious cause of back pain. Conservative measures such as rest, ice/heat and pain medicine indicated with PCP follow-up if no improvement with conservative management.      I personally performed the services described in this documentation, which was scribed in my presence. The recorded information has been reviewed and is accurate.    Renne Crigler, PA-C 05/18/13 1205

## 2013-05-22 NOTE — ED Provider Notes (Signed)
Medical screening examination/treatment/procedure(s) were performed by non-physician practitioner and as supervising physician I was immediately available for consultation/collaboration.  EKG Interpretation   None        Ruey Storer W. Raziya Aveni, MD 05/22/13 1356 

## 2015-01-05 ENCOUNTER — Emergency Department (HOSPITAL_COMMUNITY): Payer: Self-pay

## 2015-01-05 ENCOUNTER — Encounter (HOSPITAL_COMMUNITY): Payer: Self-pay | Admitting: Emergency Medicine

## 2015-01-05 ENCOUNTER — Emergency Department (HOSPITAL_COMMUNITY)
Admission: EM | Admit: 2015-01-05 | Discharge: 2015-01-05 | Disposition: A | Discharge status: Home / Self Care | Payer: Self-pay | Attending: Emergency Medicine | Admitting: Emergency Medicine

## 2015-01-05 DIAGNOSIS — I10 Essential (primary) hypertension: Secondary | ICD-10-CM | POA: Insufficient documentation

## 2015-01-05 DIAGNOSIS — Z79899 Other long term (current) drug therapy: Secondary | ICD-10-CM | POA: Insufficient documentation

## 2015-01-05 DIAGNOSIS — W01198A Fall on same level from slipping, tripping and stumbling with subsequent striking against other object, initial encounter: Secondary | ICD-10-CM | POA: Insufficient documentation

## 2015-01-05 DIAGNOSIS — Z87891 Personal history of nicotine dependence: Secondary | ICD-10-CM | POA: Insufficient documentation

## 2015-01-05 DIAGNOSIS — S20229A Contusion of unspecified back wall of thorax, initial encounter: Secondary | ICD-10-CM | POA: Insufficient documentation

## 2015-01-05 DIAGNOSIS — Y998 Other external cause status: Secondary | ICD-10-CM | POA: Insufficient documentation

## 2015-01-05 DIAGNOSIS — Y9389 Activity, other specified: Secondary | ICD-10-CM | POA: Insufficient documentation

## 2015-01-05 DIAGNOSIS — Y9289 Other specified places as the place of occurrence of the external cause: Secondary | ICD-10-CM | POA: Insufficient documentation

## 2015-01-05 MED ORDER — PREDNISONE 10 MG PO TABS: ORAL_TABLET | ORAL | Status: DC

## 2015-01-05 MED ORDER — HYDROCODONE-ACETAMINOPHEN 5-325 MG PO TABS: 2.0000 | ORAL_TABLET | ORAL | Status: DC | PRN

## 2015-01-05 MED ORDER — METHOCARBAMOL 500 MG PO TABS: 500.0000 mg | ORAL_TABLET | Freq: Four times a day (QID) | ORAL | Status: DC | PRN

## 2015-01-05 MED ORDER — OXYCODONE-ACETAMINOPHEN 5-325 MG PO TABS
2.0000 | ORAL_TABLET | Freq: Once | ORAL | Status: AC
  Administered 2015-01-05: 2 via ORAL
  Filled 2015-01-05: qty 2

## 2015-01-05 NOTE — ED Notes (Signed)
Was carrying some shingles up a ladder yesterday evening , his sneakers were wet and slipped on rung , fell backwards about 5 ft landed on rt lower back-

## 2015-01-05 NOTE — ED Provider Notes (Signed)
CSN: [id number]     Arrival date & time 01/05/15  1152 History   First MD Initiated Contact with Patient 01/05/15 1310     Chief Complaint  Patient presents with  . Back Pain     (Consider location/radiation/quality/duration/timing/severity/associated sxs/prior Treatment) Patient is a 34 y.o. male presenting with back pain. The history is provided by the patient. No language interpreter was used.  Back Pain Location:  Generalized Quality:  Aching Radiates to:  Does not radiate Pain severity:  Moderate Pain is:  Same all the time Onset quality:  Sudden Duration:  1 day Timing:  Constant Progression:  Worsening Chronicity:  New Relieved by:  Nothing Worsened by:  Nothing tried Ineffective treatments:  None tried Associated symptoms: leg pain   Associated symptoms: no weakness   Pt has a history of chronic back problems.  He fell yesterday and hit his back on a roof. Pt complains of increased pain.  Pt reports he had surgery for his bak several years ago by Dr. Bertram Millard  Past Medical History  Diagnosis Date  . Back pain   . Hypertension    Past Surgical History  Procedure Laterality Date  . Lumbar spine surgery     History reviewed. No pertinent family history. History  Substance Use Topics  . Smoking status: Former Games developer  . Smokeless tobacco: Not on file  . Alcohol Use: No    Review of Systems  Musculoskeletal: Positive for back pain.  Neurological: Negative for weakness.  All other systems reviewed and are negative.     Allergies  Mobic and Naproxen  Home Medications   Prior to Admission medications   Medication Sig Start Date End Date Taking? Authorizing Provider  acetaminophen (TYLENOL) 500 MG tablet Take 1,000 mg by mouth every 6 (six) hours as needed for moderate pain.   Yes Historical Provider, MD  methocarbamol (ROBAXIN) 500 MG tablet Take 2 tablets (1,000 mg total) by mouth 4 (four) times daily. Patient not taking: Reported on 01/05/2015 05/18/13    Renne Crigler, PA-C  oxyCODONE-acetaminophen (PERCOCET/ROXICET) 5-325 MG per tablet Take 1-2 tablets by mouth every 6 (six) hours as needed for severe pain. Patient not taking: Reported on 01/05/2015 05/18/13   Renne Crigler, PA-C   BP 144/98 mmHg  Pulse 99  Temp(Src) 98 F (36.7 C) (Oral)  Resp 16  Ht  (1.676 m)  Wt 200 lb (90.719 kg)  BMI 32.30 kg/m2  SpO2 99% Physical Exam  Constitutional: He is oriented to person, place, and time. He appears well-developed and well-nourished.  HENT:  Head: Normocephalic.  Eyes: EOM are normal. Pupils are equal, round, and reactive to light.  Neck: Normal range of motion.  Cardiovascular: Normal rate.   Pulmonary/Chest: Effort normal.  Abdominal: Soft. He exhibits no distension.  Musculoskeletal: Normal range of motion.  Neurological: He is alert and oriented to person, place, and time.  Skin: Skin is warm.  Psychiatric: He has a normal mood and affect.  Nursing note and vitals reviewed.   ED Course  Procedures (including critical care time) Labs Review Labs Reviewed - No data to display  Imaging Review Dg Lumbar Spine Complete  01/05/2015   CLINICAL DATA:  Lumbago following heavy lifting while climbing a ladder. Radicular symptoms extend into both lower extremities.  EXAM: LUMBAR SPINE - COMPLETE 4+ VIEW  COMPARISON:  Nov 28, 2009  FINDINGS: Frontal, lateral, spot lumbosacral lateral, and bilateral oblique views were obtained. There are 5 non-rib-bearing lumbar type vertebral bodies. Moderate disc  space narrowing at L4-5 is stable. Other disc spaces appear normal. There is no appreciable facet arthropathy.  IMPRESSION: Stable disc space narrowing L4-5. Other disc spaces appear normal. No fracture or spondylolisthesis. No appreciable facet arthropathy.   Electronically Signed   By: William  Woodruff III M.D.   On: 01/05/2015 13:54     EKG Interpretation None      Bretta BangMDM Pt given 2 percocet.  Pt advised to schedule to see Dr. Otelia SergeantNitka.  Pt  given rx for prednisone and hydrocodone    Final diagnoses:  Contusion, back, unspecified laterality, initial encounter    Schedule to see Dr. Otelia SergeantNitka for evaluation.    Lonia SkinnerLeslie K Glen AllenSofia, PA-C 01/05/15 1614  Bethann BerkshireJoseph Zammit, MD 01/06/15 [phone]

## 2015-01-05 NOTE — Discharge Instructions (Signed)
Back Pain, Adult °Low back pain is very common. About 1 in 5 people have back pain. The cause of low back pain is rarely dangerous. The pain often gets better over time. About half of people with a sudden onset of back pain feel better in just 2 weeks. About 8 in 10 people feel better by 6 weeks.  °CAUSES °Some common causes of back pain include: °· Strain of the muscles or ligaments supporting the spine. °· Wear and tear (degeneration) of the spinal discs. °· Arthritis. °· Direct injury to the back. °DIAGNOSIS °Most of the time, the direct cause of low back pain is not known. However, back pain can be treated effectively even when the exact cause of the pain is unknown. Answering your caregiver's questions about your overall health and symptoms is one of the most accurate ways to make sure the cause of your pain is not dangerous. If your caregiver needs more information, he or she may order lab work or imaging tests (X-rays or MRIs). However, even if imaging tests show changes in your back, this usually does not require surgery. °HOME CARE INSTRUCTIONS °For many people, back pain returns. Since low back pain is rarely dangerous, it is often a condition that people can learn to manage on their own.  °· Remain active. It is stressful on the back to sit or stand in one place. Do not sit, drive, or stand in one place for more than 30 minutes at a time. Take short walks on level surfaces as soon as pain allows. Try to increase the length of time you walk each day. °· Do not stay in bed. Resting more than 1 or 2 days can delay your recovery. °· Do not avoid exercise or work. Your body is made to move. It is not dangerous to be active, even though your back may hurt. Your back will likely heal faster if you return to being active before your pain is gone. °· Pay attention to your body when you  bend and lift. Many people have less discomfort when lifting if they bend their knees, keep the load close to their bodies, and  avoid twisting. Often, the most comfortable positions are those that put less stress on your recovering back. °· Find a comfortable position to sleep. Use a firm mattress and lie on your side with your knees slightly bent. If you lie on your back, put a pillow under your knees. °· Only take over-the-counter or prescription medicines as directed by your caregiver. Over-the-counter medicines to reduce pain and inflammation are often the most helpful. Your caregiver may prescribe muscle relaxant drugs. These medicines help dull your pain so you can more quickly return to your normal activities and healthy exercise. °· Put ice on the injured area. °¨ Put ice in a plastic bag. °¨ Place a towel between your skin and the bag. °¨ Leave the ice on for 15-20 minutes, 03-04 times a day for the first 2 to 3 days. After that, ice and heat may be alternated to reduce pain and spasms. °· Ask your caregiver about trying back exercises and gentle massage. This may be of some benefit. °· Avoid feeling anxious or stressed. Stress increases muscle tension and can worsen back pain. It is important to recognize when you are anxious or stressed and learn ways to manage it. Exercise is a great option. °SEEK MEDICAL CARE IF: °· You have pain that is not relieved with rest or medicine. °· You have pain that does not improve in 1 week. °· You have new symptoms. °· You are generally not feeling well. °SEEK   IMMEDIATE MEDICAL CARE IF:  °· You have pain that radiates from your back into your legs. °· You develop new bowel or bladder control problems. °· You have unusual weakness or numbness in your arms or legs. °· You develop nausea or vomiting. °· You develop abdominal pain. °· You feel faint. °Document Released: 06/17/2005 Document Revised: 12/17/2011 Document Reviewed: 10/19/2013 °ExitCare® Patient Information ©2015 [employer]. This information is not intended to replace advice given to you by your health care provider. Make sure you  discuss any questions you have with your health care provider. ° °Contusion °A contusion is a deep bruise. Contusions are the result of an injury that caused bleeding under the skin. The contusion may turn blue, purple, or yellow. Minor injuries will give you a painless contusion, but more severe contusions may stay painful and swollen for a few weeks.  °CAUSES  °A contusion is usually caused by a blow, trauma, or direct force to an area of the body. °SYMPTOMS  °· Swelling and redness of the injured area. °· Bruising of the injured area. °· Tenderness and soreness of the injured area. °· Pain. °DIAGNOSIS  °The diagnosis can be made by taking a history and physical exam. An X-ray, CT scan, or MRI may be needed to determine if there were any associated injuries, such as fractures. °TREATMENT  °Specific treatment will depend on what area of the body was injured. In general, the best treatment for a contusion is resting, icing, elevating, and applying cold compresses to the injured area. Over-the-counter medicines may also be recommended for pain control. Ask your caregiver what the best treatment is for your contusion. °HOME CARE INSTRUCTIONS  °· Put ice on the injured area. °¨ Put ice in a plastic bag. °¨ Place a towel between your skin and the bag. °¨ Leave the ice on for 15-20 minutes, 3-4 times a day, or as directed by your health care provider. °· Only take over-the-counter or prescription medicines for pain, discomfort, or fever as directed by your caregiver. Your caregiver may recommend avoiding anti-inflammatory medicines (aspirin, ibuprofen, and naproxen) for 48 hours because these medicines may increase bruising. °· Rest the injured area. °· If possible, elevate the injured area to reduce swelling. °SEEK IMMEDIATE MEDICAL CARE IF:  °· You have increased bruising or swelling. °· You have pain that is getting worse. °· Your swelling or pain is not relieved with medicines. °MAKE SURE YOU:  °· Understand these  instructions. °· Will watch your condition. °· Will get help right away if you are not doing well or get worse. °Document Released: 03/27/2005 Document Revised: 06/22/2013 Document Reviewed: 04/22/2011 °ExitCare® Patient Information ©2015 [employer]. This information is not intended to replace advice given to you by your health care provider. Make sure you discuss any questions you have with your health care provider. ° °

## 2015-06-27 ENCOUNTER — Emergency Department: Payer: Worker's Compensation

## 2015-06-27 ENCOUNTER — Encounter: Payer: Self-pay | Admitting: Emergency Medicine

## 2015-06-27 ENCOUNTER — Emergency Department
Admission: EM | Admit: 2015-06-27 | Discharge: 2015-06-27 | Disposition: A | Discharge status: Home / Self Care | Payer: Worker's Compensation | Attending: Emergency Medicine | Admitting: Emergency Medicine

## 2015-06-27 DIAGNOSIS — S060X0A Concussion without loss of consciousness, initial encounter: Secondary | ICD-10-CM | POA: Diagnosis not present

## 2015-06-27 DIAGNOSIS — Y9389 Activity, other specified: Secondary | ICD-10-CM | POA: Insufficient documentation

## 2015-06-27 DIAGNOSIS — W228XXA Striking against or struck by other objects, initial encounter: Secondary | ICD-10-CM | POA: Insufficient documentation

## 2015-06-27 DIAGNOSIS — M5412 Radiculopathy, cervical region: Secondary | ICD-10-CM

## 2015-06-27 DIAGNOSIS — F17200 Nicotine dependence, unspecified, uncomplicated: Secondary | ICD-10-CM | POA: Diagnosis not present

## 2015-06-27 DIAGNOSIS — Y990 Civilian activity done for income or pay: Secondary | ICD-10-CM | POA: Insufficient documentation

## 2015-06-27 DIAGNOSIS — S0101XA Laceration without foreign body of scalp, initial encounter: Secondary | ICD-10-CM | POA: Diagnosis not present

## 2015-06-27 DIAGNOSIS — S0990XA Unspecified injury of head, initial encounter: Secondary | ICD-10-CM

## 2015-06-27 DIAGNOSIS — Y9289 Other specified places as the place of occurrence of the external cause: Secondary | ICD-10-CM | POA: Diagnosis not present

## 2015-06-27 MED ORDER — LIDOCAINE-EPINEPHRINE-TETRACAINE (LET) SOLUTION
3.0000 mL | Freq: Once | NASAL | Status: AC
  Administered 2015-06-27: 18:00:00 3 mL via TOPICAL
  Filled 2015-06-27: qty 3

## 2015-06-27 MED ORDER — OXYCODONE-ACETAMINOPHEN 5-325 MG PO TABS: 1.0000 | ORAL_TABLET | Freq: Four times a day (QID) | ORAL | Status: AC | PRN

## 2015-06-27 MED ORDER — PREDNISONE 10 MG PO TABS: 10.0000 mg | ORAL_TABLET | Freq: Every day | ORAL | Status: DC

## 2015-06-27 MED ORDER — HYDROCODONE-ACETAMINOPHEN 5-325 MG PO TABS
1.0000 | ORAL_TABLET | ORAL | Status: AC
  Administered 2015-06-27: 1 via ORAL
  Filled 2015-06-27: qty 1

## 2015-06-27 MED ORDER — OXYCODONE-ACETAMINOPHEN 5-325 MG PO TABS
1.0000 | ORAL_TABLET | Freq: Once | ORAL | Status: AC
  Administered 2015-06-27: 1 via ORAL
  Filled 2015-06-27: qty 1

## 2015-06-27 MED ORDER — PREDNISONE 20 MG PO TABS
60.0000 mg | ORAL_TABLET | Freq: Once | ORAL | Status: AC
  Administered 2015-06-27: 60 mg via ORAL
  Filled 2015-06-27: qty 3

## 2015-06-27 NOTE — ED Notes (Signed)
Pt presents with getting hit with a 6 inch piece of iron pipe on top of the head. Pt was pulling on the pipe using his weight and pipe slipped hitting him on top of his head. Laceration noted to top of head, bleeding controlled, no loc at time of injury. Also c/o right arm and shoulder pain.

## 2015-06-27 NOTE — ED Provider Notes (Signed)
CSN: [id number]     Arrival date & time 06/27/15  1531 History   First MD Initiated Contact with Patient 06/27/15 1735     Chief Complaint  Patient presents with  . Head Injury  . Laceration     (Consider location/radiation/quality/duration/timing/severity/associated sxs/prior Treatment) HPI  34 year old male presents to the emergency department for evaluation of head trauma with laceration. Earlier today, patient was pulling on height at work, up height came down and hit him in the head, patient suffered a laceration. There was no loss of consciousness, nausea or vomiting. Patient has remained alert. He has had a mild headache with mild neck pain and right arm pain. He has mild numbness and tingling in the right thumb index middle and ring finger. No weakness. Has not having medications for pain. He describes his headache as mild and increased with bright lights. He denies any vision changes. This is not the worst headache of his life. She complains of mild to moderate right shoulder pain that is increased with shoulder abduction. Mild numbness in the thumb index middle and ring fingers of the right hand.    Past Medical History  Diagnosis Date  . Back pain    Past Surgical History  Procedure Laterality Date  . Lumbar spine surgery     No family history on file. Social History  Substance Use Topics  . Smoking status: Current Some Day Smoker  . Smokeless tobacco: None  . Alcohol Use: No    Review of Systems  Constitutional: Negative.  Negative for fever, chills, activity change and appetite change.  HENT: Negative for congestion, ear pain, mouth sores, rhinorrhea, sinus pressure, sore throat and trouble swallowing.   Eyes: Negative for photophobia, pain and discharge.  Respiratory: Negative for cough, chest tightness, shortness of breath and wheezing.   Cardiovascular: Negative for chest pain and leg swelling.  Gastrointestinal: Negative for nausea, vomiting, abdominal pain,  diarrhea and abdominal distention.  Genitourinary: Negative for dysuria and difficulty urinating.  Musculoskeletal: Positive for neck pain. Negative for back pain, arthralgias and gait problem.  Skin: Negative for color change and rash.  Neurological: Positive for numbness and headaches. Negative for dizziness.  Hematological: Negative for adenopathy.  Psychiatric/Behavioral: Negative for behavioral problems and agitation.      Allergies  Mobic and Naproxen  Home Medications   Prior to Admission medications   Medication Sig Start Date End Date Taking? Authorizing Provider  acetaminophen (TYLENOL) 500 MG tablet Take 1,000 mg by mouth every 6 (six) hours as needed for moderate pain.    Historical Provider, MD  HYDROcodone-acetaminophen (NORCO/VICODIN) 5-325 MG per tablet Take 2 tablets by mouth every 4 (four) hours as needed. 01/05/15   Elson Areas, PA-C  methocarbamol (ROBAXIN) 500 MG tablet Take 1 tablet (500 mg total) by mouth every 6 (six) hours as needed for muscle spasms. 01/05/15   Elson Areas, PA-C  oxyCODONE-acetaminophen (ROXICET) 5-325 MG tablet Take 1 tablet by mouth every 6 (six) hours as needed. 06/27/15 06/26/16  Evon Slack, PA-C  predniSONE (DELTASONE) 10 MG tablet Take 1 tablet (10 mg total) by mouth daily. 6,5,4,3,2,1 six day taper 06/27/15   Evon Slack, PA-C   BP 155/97 mmHg  Pulse 92  Temp(Src) 97.6 F (36.4 C) (Oral)  Resp 20  Ht  (1.676 m)  Wt 86.183 kg  BMI 30.68 kg/m2  SpO2 97% Physical Exam  Constitutional: He is oriented to person, place, and time. He appears well-developed and well-nourished.  HENT:  Head: Normocephalic.  Right Ear: External ear normal.  Left Ear: External ear normal.  4.0 cm laceration on the top of the scalp just right to midline. Laceration is linear and no sign of foreign body.  Eyes: Conjunctivae and EOM are normal. Pupils are equal, round, and reactive to light.  Neck: Normal range of motion. Neck supple.   Cardiovascular: Normal rate, regular rhythm, normal heart sounds and intact distal pulses.   Pulmonary/Chest: Effort normal and breath sounds normal. No respiratory distress. He has no wheezes. He has no rales. He exhibits no tenderness.  Abdominal: Soft. Bowel sounds are normal. He exhibits no distension. There is no tenderness.  Musculoskeletal:  Examination of cervical spine shows patient has no spinous process tenderness. There is mild left and right paravertebral muscle tenderness. Has full range of motion with flexion and extension lateral bending and rotation. Examination of the right upper extremity shows patient has decreased range of motion with flexion and abduction. Negative Hawkins, and impingement test. Negative drop arm test. Patient has full composite fist with grip strength 5 out of 5, 5 out of 5 biceps, triceps strength. Also 2+ cap Refill.  Neurological: He is alert and oriented to person, place, and time.  Skin: Skin is warm and dry.  Psychiatric: He has a normal mood and affect. His behavior is normal. Judgment and thought content normal.    ED Course  Procedures (including critical care time) LACERATION REPAIR Performed by: Patience Musca Authorized by: Patience Musca Consent: Verbal consent obtained. Risks and benefits: risks, benefits and alternatives were discussed Consent given by: patient Patient identity confirmed: provided demographic data Prepped and Draped in normal sterile fashion Wound explored  Laceration Location: Scalp, right parietal  Laceration Length: 4 cm  No Foreign Bodies seen or palpated  Anesthesia: local infiltration  Local anesthetic: lidocaine , epinephrine, tetracaine topical   Anesthetic total: 2 ml  Irrigation method: syringe Amount of cleaning: standard  Skin closure: Sutures 6-0 nylon   Number of sutures: 5   Technique: Simple interrupted   Patient tolerance: Patient tolerated the procedure well  with no immediate complications.   Labs Review Labs Reviewed - No data to display  Imaging Review Dg Cervical Spine Complete  06/27/2015  CLINICAL DATA:  Neck pain, no known injury, initial encounter EXAM: CERVICAL SPINE - COMPLETE 4+ VIEW COMPARISON:  None. FINDINGS: There is no evidence of cervical spine fracture or prevertebral soft tissue swelling. Alignment is normal. No other significant bone abnormalities are identified. IMPRESSION: No acute abnormality noted. Electronically Signed   By: Alcide Clever M.D.   On: 06/27/2015 18:40   Dg Shoulder Right  06/27/2015  CLINICAL DATA:  Back pain, neck pain EXAM: RIGHT SHOULDER - 2+ VIEW COMPARISON:  None. FINDINGS: There is no evidence of fracture or dislocation. 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 There is no evidence of arthropathy or other focal bone abnormality. Soft tissues are unremarkable. IMPRESSION: No acute osseous injury of the comment. Electronically Signed   By: Elige Ko   On: 06/27/2015 18:43   Ct Head Wo Contrast  06/27/2015  CLINICAL DATA:  Struck on top of head with a 6 inch iron pipe, was pulling on it using his weight and it slipped, laceration, no loss of consciousness, having RIGHT arm and shoulder pain as well EXAM: CT HEAD WITHOUT CONTRAST TECHNIQUE: Contiguous axial images were obtained from the base of the skull through the vertex without intravenous contrast. COMPARISON:  01/25/2010  FINDINGS: Normal ventricular morphology. No midline shift or mass effect. Normal appearance of brain parenchyma. No intracranial hemorrhage, mass lesion, or acute infarction. No extra-axial fluid collections. Visualized paranasal sinuses and mastoid air cells clear. Bones unremarkable. IMPRESSION: No acute intracranial abnormalities. Electronically Signed   By: Ulyses SouthwardMark  Boles M.D.   On: 06/27/2015 16:40   I have personally reviewed and evaluated these images and lab results as part of my medical decision-making.   EKG  Interpretation None      MDM   Final diagnoses:  Laceration of scalp, initial encounter  Head trauma, initial encounter  Concussion, without loss of consciousness, initial encounter  Cervical radiculopathy, acute    34 year old male with a Worker's Comp. injury to the scalp and neck. Patient suffered mild concussion. Was no loss of consciousness, no nausea or vomiting. CT of the head negative. Patient was educated on concussion management. He will refrain from driving for 24 hours and remain out of work for the next 2 days. He will have sutures removed in 7 days. Patient also with right cervical radiculopathy. X-rays of the neck and right shoulder show no evidence of acute bony abnormality. He is placed on a 6 day prednisone taper. He is also given Percocet 5-3 25 one tab by mouth every 4-6 hours when necessary pain quantity #20 with 0 refills. Return to the ER for any worsening symptoms or urgent changes in his health. Patient was educated on red flags to return to the ER for.   Evon Slackhomas C Gaines, PA-C 06/27/15 1923  Jennye MoccasinBrian S Quigley, MD 06/27/15 [phone]

## 2015-06-27 NOTE — Discharge Instructions (Signed)
Cervical Radiculopathy Cervical radiculopathy happens when a nerve in the neck (cervical nerve) is pinched or bruised. This condition can develop because of an injury or as part of the normal aging process. Pressure on the cervical nerves can cause pain or numbness that runs from the neck all the way down into the arm and fingers. Usually, this condition gets better with rest. Treatment may be needed if the condition does not improve.  CAUSES This condition may be caused by:  Injury.  Slipped (herniated) disk.  Muscle tightness in the neck because of overuse.  Arthritis.  Breakdown or degeneration in the bones and joints of the spine (spondylosis) due to aging.  Bone spurs that may develop near the cervical nerves. SYMPTOMS Symptoms of this condition include:  Pain that runs from the neck to the arm and hand. The pain can be severe or irritating. It may be worse when the neck is moved.  Numbness or weakness in the affected arm and hand. DIAGNOSIS This condition may be diagnosed based on symptoms, medical history, and a physical exam. You may also have tests, including:  X-rays.  CT scan.  MRI.  Electromyogram (EMG).  Nerve conduction tests. TREATMENT In many cases, treatment is not needed for this condition. With rest, the condition usually gets better over time. If treatment is needed, options may include:  Wearing a soft neck collar for short periods of time.  Physical therapy to strengthen your neck muscles.  Medicines, such as NSAIDs, oral corticosteroids, or spinal injections.  Surgery. This may be needed if other treatments do not help. Various types of surgery may be done depending on the cause of your problems. HOME CARE INSTRUCTIONS Managing Pain  Take over-the-counter and prescription medicines only as told by your health care provider.  If directed, apply ice to the affected area.  Put ice in a plastic bag.  Place a towel between your skin and the  bag.  Leave the ice on for 20 minutes, 2-3 times per day.  If ice does not help, you can try using heat. Take a warm shower or warm bath, or use a heat pack as told by your health care provider.  Try a gentle neck and shoulder massage to help relieve symptoms. Activity  Rest as needed. Follow instructions from your health care provider about any restrictions on activities.  Do stretching and strengthening exercises as told by your health care provider or physical therapist. General Instructions  If you were given a soft collar, wear it as told by your health care provider.  Use a flat pillow when you sleep.  Keep all follow-up visits as told by your health care provider. This is important. SEEK MEDICAL CARE IF:  Your condition does not improve with treatment. SEEK IMMEDIATE MEDICAL CARE IF:  Your pain gets much worse and cannot be controlled with medicines.  You have weakness or numbness in your hand, arm, face, or leg.  You have a high fever.  You have a stiff, rigid neck.  You lose control of your bowels or your bladder (have incontinence).  You have trouble with walking, balance, or speaking.   This information is not intended to replace advice given to you by your health care provider. Make sure you discuss any questions you have with your health care provider.   Document Released: 03/12/2001 Document Revised: 03/08/2015 Document Reviewed: 08/11/2014 Elsevier Interactive Patient Education 2016 [employer]  Concussion, Adult A concussion, or closed-head injury, is a brain injury caused by  a direct blow to the head or by a quick and sudden movement (jolt) of the head or neck. Concussions are usually not life-threatening. Even so, the effects of a concussion can be serious. If you have had a concussion before, you are more likely to experience concussion-like symptoms after a direct blow to the head.  CAUSES  Direct blow to the head, such as from running into another  player during a soccer game, being hit in a fight, or hitting your head on a hard surface.  A jolt of the head or neck that causes the brain to move back and forth inside the skull, such as in a car crash. SIGNS AND SYMPTOMS The signs of a concussion can be hard to notice. Early on, they may be missed by you, family members, and health care providers. You may look fine but act or feel differently. Symptoms are usually temporary, but they may last for days, weeks, or even longer. Some symptoms may appear right away while others may not show up for hours or days. Every head injury is different. Symptoms include:  Mild to moderate headaches that will not go away.  A feeling of pressure inside your head.  Having more trouble than usual:  Learning or remembering things you have heard.  Answering questions.  Paying attention or concentrating.  Organizing daily tasks.  Making decisions and solving problems.  Slowness in thinking, acting or reacting, speaking, or reading.  Getting lost or being easily confused.  Feeling tired all the time or lacking energy (fatigued).  Feeling drowsy.  Sleep disturbances.  Sleeping more than usual.  Sleeping less than usual.  Trouble falling asleep.  Trouble sleeping (insomnia).  Loss of balance or feeling lightheaded or dizzy.  Nausea or vomiting.  Numbness or tingling.  Increased sensitivity to:  Sounds.  Lights.  Distractions.  Vision problems or eyes that tire easily.  Diminished sense of taste or smell.  Ringing in the ears.  Mood changes such as feeling sad or anxious.  Becoming easily irritated or angry for little or no reason.  Lack of motivation.  Seeing or hearing things other people do not see or hear (hallucinations). DIAGNOSIS Your health care provider can usually diagnose a concussion based on a description of your injury and symptoms. He or she will ask whether you passed out (lost consciousness) and whether  you are having trouble remembering events that happened right before and during your injury. Your evaluation might include:  A brain scan to look for signs of injury to the brain. Even if the test shows no injury, you may still have a concussion.  Blood tests to be sure other problems are not present. TREATMENT  Concussions are usually treated in an emergency department, in urgent care, or at a clinic. You may need to stay in the hospital overnight for further treatment.  Tell your health care provider if you are taking any medicines, including prescription medicines, over-the-counter medicines, and natural remedies. Some medicines, such as blood thinners (anticoagulants) and aspirin, may increase the chance of complications. Also tell your health care provider whether you have had alcohol or are taking illegal drugs. This information may affect treatment.  Your health care provider will send you home with important instructions to follow.  How fast you will recover from a concussion depends on many factors. These factors include how severe your concussion is, what part of your brain was injured, your age, and how healthy you were before the concussion.  Most  people with mild injuries recover fully. Recovery can take time. In general, recovery is slower in older persons. Also, persons who have had a concussion in the past or have other medical problems may find that it takes longer to recover from their current injury. HOME CARE INSTRUCTIONS General Instructions  Carefully follow the directions your health care provider gave you.  Only take over-the-counter or prescription medicines for pain, discomfort, or fever as directed by your health care provider.  Take only those medicines that your health care provider has approved.  Do not drink alcohol until your health care provider says you are well enough to do so. Alcohol and certain other drugs may slow your recovery and can put you at risk  of further injury.  If it is harder than usual to remember things, write them down.  If you are easily distracted, try to do one thing at a time. For example, do not try to watch TV while fixing dinner.  Talk with family members or close friends when making important decisions.  Keep all follow-up appointments. Repeated evaluation of your symptoms is recommended for your recovery.  Watch your symptoms and tell others to do the same. Complications sometimes occur after a concussion. Older adults with a brain injury may have a higher risk of serious complications, such as a blood clot on the brain.  Tell your teachers, school nurse, school counselor, coach, athletic trainer, or work Production designer, theatre/television/film about your injury, symptoms, and restrictions. Tell them about what you can or cannot do. They should watch for:  Increased problems with attention or concentration.  Increased difficulty remembering or learning new information.  Increased time needed to complete tasks or assignments.  Increased irritability or decreased ability to cope with stress.  Increased symptoms.  Rest. Rest helps the brain to heal. Make sure you:  Get plenty of sleep at night. Avoid staying up late at night.  Keep the same bedtime hours on weekends and weekdays.  Rest during the day. Take daytime naps or rest breaks when you feel tired.  Limit activities that require a lot of thought or concentration. These include:  Doing homework or job-related work.  Watching TV.  Working on the computer.  Avoid any situation where there is potential for another head injury (football, hockey, soccer, basketball, martial arts, downhill snow sports and horseback riding). Your condition will get worse every time you experience a concussion. You should avoid these activities until you are evaluated by the appropriate follow-up health care providers. Returning To Your Regular Activities You will need to return to your normal  activities slowly, not all at once. You must give your body and brain enough time for recovery.  Do not return to sports or other athletic activities until your health care provider tells you it is safe to do so.  Ask your health care provider when you can drive, ride a bicycle, or operate heavy machinery. Your ability to react may be slower after a brain injury. Never do these activities if you are dizzy.  Ask your health care provider about when you can return to work or school. Preventing Another Concussion It is very important to avoid another brain injury, especially before you have recovered. In rare cases, another injury can lead to permanent brain damage, brain swelling, or death. The risk of this is greatest during the first 7-10 days after a head injury. Avoid injuries by:  Wearing a seat belt when riding in a car.  Drinking alcohol only in  moderation.  Wearing a helmet when biking, skiing, skateboarding, skating, or doing similar activities.  Avoiding activities that could lead to a second concussion, such as contact or recreational sports, until your health care provider says it is okay.  Taking safety measures in your home.  Remove clutter and tripping hazards from floors and stairways.  Use grab bars in bathrooms and handrails by stairs.  Place non-slip mats on floors and in bathtubs.  Improve lighting in dim areas. SEEK MEDICAL CARE IF:  You have increased problems paying attention or concentrating.  You have increased difficulty remembering or learning new information.  You need more time to complete tasks or assignments than before.  You have increased irritability or decreased ability to cope with stress.  You have more symptoms than before. Seek medical care if you have any of the following symptoms for more than 2 weeks after your injury:  Lasting (chronic) headaches.  Dizziness or balance problems.  Nausea.  Vision problems.  Increased sensitivity  to noise or light.  Depression or mood swings.  Anxiety or irritability.  Memory problems.  Difficulty concentrating or paying attention.  Sleep problems.  Feeling tired all the time. SEEK IMMEDIATE MEDICAL CARE IF:  You have severe or worsening headaches. These may be a sign of a blood clot in the brain.  You have weakness (even if only in one hand, leg, or part of the face).  You have numbness.  You have decreased coordination.  You vomit repeatedly.  You have increased sleepiness.  One pupil is larger than the other.  You have convulsions.  You have slurred speech.  You have increased confusion. This may be a sign of a blood clot in the brain.  You have increased restlessness, agitation, or irritability.  You are unable to recognize people or places.  You have neck pain.  It is difficult to wake you up.  You have unusual behavior changes.  You lose consciousness. MAKE SURE YOU:  Understand these instructions.  Will watch your condition.  Will get help right away if you are not doing well or get worse.   This information is not intended to replace advice given to you by your health care provider. Make sure you discuss any questions you have with your health care provider.   Document Released: 09/07/2003 Document Revised: 07/08/2014 Document Reviewed: 01/07/2013 Elsevier Interactive Patient Education 2016 [employer]  Head Injury, Adult You have received a head injury. It does not appear serious at this time. Headaches and vomiting are common following head injury. It should be easy to awaken from sleeping. Sometimes it is necessary for you to stay in the emergency department for a while for observation. Sometimes admission to the hospital may be needed. After injuries such as yours, most problems occur within the first 24 hours, but side effects may occur up to 7-10 days after the injury. It is important for you to carefully monitor your condition and  contact your health care provider or seek immediate medical care if there is a change in your condition. WHAT ARE THE TYPES OF HEAD INJURIES? Head injuries can be as minor as a bump. Some head injuries can be more severe. More severe head injuries include:  A jarring injury to the brain (concussion).  A bruise of the brain (contusion). This mean there is bleeding in the brain that can cause swelling.  A cracked skull (skull fracture).  Bleeding in the brain that collects, clots, and forms a bump (hematoma). WHAT  CAUSES A HEAD INJURY? A serious head injury is most likely to happen to someone who is in a car wreck and is not wearing a seat belt. Other causes of major head injuries include bicycle or motorcycle accidents, sports injuries, and falls. HOW ARE HEAD INJURIES DIAGNOSED? A complete history of the event leading to the injury and your current symptoms will be helpful in diagnosing head injuries. Many times, pictures of the brain, such as CT or MRI are needed to see the extent of the injury. Often, an overnight hospital stay is necessary for observation.  WHEN SHOULD I SEEK IMMEDIATE MEDICAL CARE?  You should get help right away if:  You have confusion or drowsiness.  You feel sick to your stomach (nauseous) or have continued, forceful vomiting.  You have dizziness or unsteadiness that is getting worse.  You have severe, continued headaches not relieved by medicine. Only take over-the-counter or prescription medicines for pain, fever, or discomfort as directed by your health care provider.  You do not have normal function of the arms or legs or are unable to walk.  You notice changes in the black spots in the center of the colored part of your eye (pupil).  You have a clear or bloody fluid coming from your nose or ears.  You have a loss of vision. During the next 24 hours after the injury, you must stay with someone who can watch you for the warning signs. This person should  contact local emergency services (911 in the [place] if you have seizures, you become unconscious, or you are unable to wake up. HOW CAN I PREVENT A HEAD INJURY IN THE FUTURE? The most important factor for preventing major head injuries is avoiding motor vehicle accidents. To minimize the potential for damage to your head, it is crucial to wear seat belts while riding in motor vehicles. Wearing helmets while bike riding and playing collision sports (like football) is also helpful. Also, avoiding dangerous activities around the house will further help reduce your risk of head injury.  WHEN CAN I RETURN TO NORMAL ACTIVITIES AND ATHLETICS? You should be reevaluated by your health care provider before returning to these activities. If you have any of the following symptoms, you should not return to activities or contact sports until 1 week after the symptoms have stopped:  Persistent headache.  Dizziness or vertigo.  Poor attention and concentration.  Confusion.  Memory problems.  Nausea or vomiting.  Fatigue or tire easily.  Irritability.  Intolerant of bright lights or loud noises.  Anxiety or depression.  Disturbed sleep. MAKE SURE YOU:   Understand these instructions.  Will watch your condition.  Will get help right away if you are not doing well or get worse.   This information is not intended to replace advice given to you by your health care provider. Make sure you discuss any questions you have with your health care provider.   Document Released: 06/17/2005 Document Revised: 07/08/2014 Document Reviewed: 02/22/2013 Elsevier Interactive Patient Education 2016 [employer]  Cryotherapy Cryotherapy is when you put ice on your injury. Ice helps lessen pain and puffiness (swelling) after an injury. Ice works the best when you start using it in the first 24 to 48 hours after an injury. HOME CARE  Put a dry or damp towel between the ice pack and your skin.  You may press  gently on the ice pack.  Leave the ice on for no more than 10 to 20 minutes at a  time.  Check your skin after 5 minutes to make sure your skin is okay.  Rest at least 20 minutes between ice pack uses.  Stop using ice when your skin loses feeling (numbness).  Do not use ice on someone who cannot tell you when it hurts. This includes small children and people with memory problems (dementia). GET HELP RIGHT AWAY IF:  You have white spots on your skin.  Your skin turns blue or pale.  Your skin feels waxy or hard.  Your puffiness gets worse. MAKE SURE YOU:   Understand these instructions.  Will watch your condition.  Will get help right away if you are not doing well or get worse.   This information is not intended to replace advice given to you by your health care provider. Make sure you discuss any questions you have with your health care provider.   Document Released: 12/04/2007 Document Revised: 09/09/2011 Document Reviewed: 02/07/2011 Elsevier Interactive Patient Education [employer]

## 2015-06-27 NOTE — ED Notes (Signed)
Pipe that fell on pt weighs about 20lbs.

## 2016-06-26 ENCOUNTER — Emergency Department (HOSPITAL_COMMUNITY): Payer: Self-pay

## 2016-06-26 ENCOUNTER — Encounter (HOSPITAL_COMMUNITY): Payer: Self-pay | Admitting: Emergency Medicine

## 2016-06-26 ENCOUNTER — Emergency Department (HOSPITAL_COMMUNITY)
Admission: EM | Admit: 2016-06-26 | Discharge: 2016-06-27 | Disposition: A | Discharge status: Home / Self Care | Payer: Self-pay | Attending: Emergency Medicine | Admitting: Emergency Medicine

## 2016-06-26 DIAGNOSIS — F17200 Nicotine dependence, unspecified, uncomplicated: Secondary | ICD-10-CM | POA: Insufficient documentation

## 2016-06-26 DIAGNOSIS — X500XXA Overexertion from strenuous movement or load, initial encounter: Secondary | ICD-10-CM | POA: Insufficient documentation

## 2016-06-26 DIAGNOSIS — Z79899 Other long term (current) drug therapy: Secondary | ICD-10-CM | POA: Insufficient documentation

## 2016-06-26 DIAGNOSIS — Y999 Unspecified external cause status: Secondary | ICD-10-CM | POA: Insufficient documentation

## 2016-06-26 DIAGNOSIS — S335XXA Sprain of ligaments of lumbar spine, initial encounter: Secondary | ICD-10-CM

## 2016-06-26 DIAGNOSIS — S339XXA Sprain of unspecified parts of lumbar spine and pelvis, initial encounter: Secondary | ICD-10-CM | POA: Insufficient documentation

## 2016-06-26 DIAGNOSIS — Y929 Unspecified place or not applicable: Secondary | ICD-10-CM | POA: Insufficient documentation

## 2016-06-26 DIAGNOSIS — Y939 Activity, unspecified: Secondary | ICD-10-CM | POA: Insufficient documentation

## 2016-06-26 MED ORDER — HYDROCODONE-ACETAMINOPHEN 5-325 MG PO TABS
1.0000 | ORAL_TABLET | Freq: Once | ORAL | Status: AC
Start: 2016-06-27 — End: 2016-06-27
  Administered 2016-06-27: 1 via ORAL
  Filled 2016-06-26: qty 1

## 2016-06-26 MED ORDER — KETOROLAC TROMETHAMINE 30 MG/ML IJ SOLN
30.0000 mg | Freq: Once | INTRAMUSCULAR | Status: AC
  Administered 2016-06-26: 30 mg via INTRAMUSCULAR
  Filled 2016-06-26: qty 1

## 2016-06-26 MED ORDER — HYDROCODONE-ACETAMINOPHEN 5-325 MG PO TABS
1.0000 | ORAL_TABLET | Freq: Once | ORAL | Status: AC
  Administered 2016-06-26: 1 via ORAL
  Filled 2016-06-26: qty 1

## 2016-06-26 NOTE — ED Provider Notes (Signed)
[facility]-EMERGENCY DEPT Provider Note   CSN: [id number] Arrival date & time: 06/26/16  2043     History   Chief Complaint Chief Complaint  Patient presents with  . Back Injury   The history is provided by the patient. No language interpreter was used.    HPI Comments:  Alexander Morgan is a 35 y.o. male with pmhx of chronic back pain who presents to the Emergency Department complaining of sudden-onset constant moderate worsening right lower back pain beginning earlier today. He describes his pain as an 8/10 aching pain that radiates down his right leg. Pt states that he was lifting a washing machine when he felt a sharp pain in is right lateral lower back. He has an extensive history of back pain and states that his pain today is worse than his chronic pain. Patient does have history of back surgery for herniated disc. He has seen an orthopedist in the past for his back surgery. Patient had MRI beginning of the year. Patient has known herniated disc that needs surgery however does not have insurance to pay for it. He was following up with the pain management clinic as referred by the orthopedic surgeon. He was seen by pain management and was paying cash for pain medicine but his pain doctor retired and couldn't pay cash for pain medicine anymore. He would like referral back to Dr. Otelia SergeantNitka. Pt denies fever, chills, headache, vision changes, loss of bowel or bladder, saddle paresthesias, history of IV drug use, history of cancer, paresthesias of the lower extremities, urinary symptoms, change in bowel habits. Patient is able to walk but with some pain.   Past Medical History:  Diagnosis Date  . Back pain     There are no active problems to display for this patient.   Past Surgical History:  Procedure Laterality Date  . LUMBAR SPINE SURGERY       Home Medications    Prior to Admission medications   Medication Sig Start Date End Date Taking? Authorizing Provider  acetaminophen (TYLENOL)  500 MG tablet Take 1,000 mg by mouth every 6 (six) hours as needed for moderate pain.    Historical Provider, MD  HYDROcodone-acetaminophen (NORCO/VICODIN) 5-325 MG per tablet Take 2 tablets by mouth every 4 (four) hours as needed. 01/05/15   Elson AreasLeslie K Sofia, PA-C  methocarbamol (ROBAXIN) 500 MG tablet Take 1 tablet (500 mg total) by mouth every 6 (six) hours as needed for muscle spasms. 01/05/15   Elson AreasLeslie K Sofia, PA-C  oxyCODONE-acetaminophen (ROXICET) 5-325 MG tablet Take 1 tablet by mouth every 6 (six) hours as needed. 06/27/15 06/26/16  Evon Slackhomas C Gaines, PA-C  predniSONE (DELTASONE) 10 MG tablet Take 1 tablet (10 mg total) by mouth daily. 6,5,4,3,2,1 six day taper 06/27/15   Evon Slackhomas C Gaines, PA-C    Family History No family history on file.  Social History Social History  Substance Use Topics  . Smoking status: Current Some Day Smoker  . Smokeless tobacco: Never Used  . Alcohol use No     Allergies   Mobic [meloxicam] and Naproxen   Review of Systems Review of Systems  Constitutional: Negative for chills and fever.  Genitourinary: Negative for flank pain, frequency and urgency.       Negative for Incontinence  Musculoskeletal: Positive for back pain and gait problem.  Skin: Negative for wound.  Neurological: Negative for dizziness, weakness, light-headedness, numbness and headaches.     Physical Exam Updated Vital Signs BP 132/90 (BP Location: Right Arm)  Pulse 81   Temp 97.6 F (36.4 C) (Oral)   Resp 18   Ht 5\' 6"  (1.676 m)   Wt 97.5 kg   SpO2 99%   BMI 34.70 kg/m   Physical Exam  Constitutional: He is oriented to person, place, and time. He appears well-developed and well-nourished. No distress.  HENT:  Head: Normocephalic and atraumatic.  Eyes: Conjunctivae and EOM are normal. Pupils are equal, round, and reactive to light.  Neck: Normal range of motion. Neck supple.  No midline C-spine tenderness. No deformities or step-offs noted.  Cardiovascular: Normal  rate.   Pulmonary/Chest: Effort normal.  Abdominal: He exhibits no distension.  Musculoskeletal: Normal range of motion.  Patient with no T-spine or L-spine midline tenderness. No deformities or step-offs noted. Healed surgical scar noted on the midline of the L-spine. Patient does have significant tenderness to the right paraspinal muscle of the lumbar region. Positive straight leg raise test the right. No pain with palpation to the left paraspinal muscle. Tense musculature noted. Strength is 5 out of 5 in lower extremities. Sensation is intact to proprioception. Cap refill is normal. DP pulses are 2+ bilaterally. Limited range of motion the lumbar spine due to pain.  Neurological: He is alert and oriented to person, place, and time.  Patellar reflexes 2+ bilaterally. Achilles reflexes are 2+ bilaterally.  Skin: Skin is warm and dry. Capillary refill takes less than 2 seconds.  Psychiatric: He has a normal mood and affect.  Nursing note and vitals reviewed.    ED Treatments / Results  DIAGNOSTIC STUDIES:  Oxygen Saturation is 100% on RA, normal by my interpretation.    COORDINATION OF CARE:  10:19 PM Discussed treatment plan with pt at bedside and pt agreed to plan.  Labs (all labs ordered are listed, but only abnormal results are displayed) Labs Reviewed - No data to display  EKG  EKG Interpretation None       Radiology Dg Lumbar Spine Complete  Result Date: 06/26/2016 CLINICAL DATA:  Low back pain after lifting injury. EXAM: LUMBAR SPINE - COMPLETE 4+ VIEW COMPARISON:  01/05/2015 lumbar spine radiographs. FINDINGS: There is no evidence of lumbar spine fracture. Chronic stable mild disc space narrowing at L4-5. Alignment is normal. No bone destruction. Slight facet joint space narrowing and sclerosis at L4-5 and L5-S1. No spondylolysis nor spondylolisthesis. IMPRESSION: No acute osseous abnormality. Slight disc space narrowing at L4-5 with minimal facet joint space narrowing as  well at L4-5 and L5-S1. Electronically Signed   By: Tollie Ethavid  Kwon M.D.   On: 06/26/2016 23:10    Procedures Procedures (including critical care time)  Medications Ordered in ED Medications  HYDROcodone-acetaminophen (NORCO/VICODIN) 5-325 MG per tablet 1 tablet (1 tablet Oral Given 06/26/16 2235)  ketorolac (TORADOL) 30 MG/ML injection 30 mg (30 mg Intramuscular Given 06/26/16 2235)  HYDROcodone-acetaminophen (NORCO/VICODIN) 5-325 MG per tablet 1 tablet (1 tablet Oral Given 06/27/16 0001)     Initial Impression / Assessment and Plan / ED Course  I have reviewed the triage vital signs and the nursing notes.  Pertinent labs & imaging results that were available during my care of the patient were reviewed by me and considered in my medical decision making (see chart for details).  Clinical Course   Patient presents to the ED today after right-sided low back pain after lifting a washer and dryer. Patient has history of chronic low back pain with several surgeries due to slipped disc. Patient has known herniated disc that needs surgery however  patient is able to afford surgery. Patient does have a spine doctor. Patient has no neurological deficits with a normal neuro exam. Patient can walk but states is painful. No loss of bowel or bladder. On reexam after initial assessment patient states that he has been having some left-sided inner groin numbness that has been intermittent for the past year. He has discussed this with his orthopedic surgeon who referred him to pain management. Patient states this is not new since the injury today. Patient has no paresthesias of lower extremity. He has a positive right straight leg raise. This is likely worsening low back pain with sciatic pain that was aggravated by the injury today.  No concern for cauda equina given that groin numbness has been intermittent for the past year and he has seen a orthopedic surgeon for same.  No fever, night sweats, weight loss, h/o  cancer, IVDU, urinary symptoms.  Given patient's normal neuro exam feel the patient would benefit from an outpatient MRI. Discussed with the patient muscle relaxers. He states that he is unable to take Robaxin and Flexeril because it messes with his high blood pressure. He states valium does work for him. However he would like pain medicines instead of Valium today. Patient was inquired on the drug database that shows no narcotic prescriptions. X-ray shows no acute abnormalities. He does have known disc space narrowing. RICE protocol and pain medicine indicated and discussed with patient. I have encouraged patient to follow-up with his orthopedic doctor. I have given him another referral. I have also given him strict return precautions including loss of bowel or bladder, worsening groin numbness, lower extremity paresthesias, urinary retention. Pain was slightly managed in the ED with pain medicine. He was also given a prescription for a Medrol Dosepak for inflammation. Discussed patient with Dr. Erma Heritage who agrees with the above plan. Pt is hemodynamically stable, in NAD, & able to ambulate in the ED. Pain has been managed & has no complaints prior to dc. Pt is comfortable with above plan and is stable for discharge at this time. All questions were answered prior to disposition.     Final Clinical Impressions(s) / ED Diagnoses   Final diagnoses:  Lumbar sprain, initial encounter    New Prescriptions Discharge Medication List as of 06/27/2016 12:26 AM    I personally performed the services described in this documentation, which was scribed in my presence. The recorded information has been reviewed and is accurate.     Rise Mu, PA-C 06/27/16 0113    Shaune Pollack, MD 06/27/16 1146

## 2016-06-26 NOTE — ED Triage Notes (Signed)
Pt. reports right lower back pain onset today after lifting heavy washing machine .

## 2016-06-27 MED ORDER — HYDROCODONE-ACETAMINOPHEN 5-325 MG PO TABS: 1.0000 | ORAL_TABLET | Freq: Four times a day (QID) | ORAL | 0 refills | Status: DC | PRN

## 2016-06-27 MED ORDER — DIAZEPAM 5 MG PO TABS: 5.0000 mg | ORAL_TABLET | Freq: Two times a day (BID) | ORAL | 0 refills | Status: DC

## 2016-06-27 MED ORDER — METHYLPREDNISOLONE 4 MG PO TBPK: ORAL_TABLET | ORAL | 0 refills | Status: DC

## 2016-06-27 NOTE — Discharge Instructions (Signed)
Please take the steroids as prescribed. You may take the pain medicine as needed. You need to follow up with Dr. Otelia SergeantNitka for further imaging and possible MRI. If you loss control of your bowel or bladder, develop worsening groin numbness, fevers, numbness in your lower extremities please return to the Emergency department immediatly. Follow up with orthopedics.

## 2016-06-29 ENCOUNTER — Emergency Department
Admission: EM | Admit: 2016-06-29 | Discharge: 2016-06-29 | Disposition: A | Discharge status: Home / Self Care | Payer: Self-pay | Attending: Emergency Medicine | Admitting: Emergency Medicine

## 2016-06-29 ENCOUNTER — Emergency Department: Payer: Self-pay

## 2016-06-29 DIAGNOSIS — R51 Headache: Secondary | ICD-10-CM | POA: Insufficient documentation

## 2016-06-29 DIAGNOSIS — S0230XA Fracture of orbital floor, unspecified side, initial encounter for closed fracture: Secondary | ICD-10-CM

## 2016-06-29 DIAGNOSIS — S0501XA Injury of conjunctiva and corneal abrasion without foreign body, right eye, initial encounter: Secondary | ICD-10-CM | POA: Insufficient documentation

## 2016-06-29 DIAGNOSIS — S0231XA Fracture of orbital floor, right side, initial encounter for closed fracture: Secondary | ICD-10-CM | POA: Insufficient documentation

## 2016-06-29 DIAGNOSIS — F17210 Nicotine dependence, cigarettes, uncomplicated: Secondary | ICD-10-CM | POA: Insufficient documentation

## 2016-06-29 DIAGNOSIS — Y999 Unspecified external cause status: Secondary | ICD-10-CM | POA: Insufficient documentation

## 2016-06-29 DIAGNOSIS — Y939 Activity, unspecified: Secondary | ICD-10-CM | POA: Insufficient documentation

## 2016-06-29 DIAGNOSIS — Y929 Unspecified place or not applicable: Secondary | ICD-10-CM | POA: Insufficient documentation

## 2016-06-29 MED ORDER — FLUORESCEIN SODIUM 0.6 MG OP STRP
ORAL_STRIP | OPHTHALMIC | Status: AC
  Filled 2016-06-29: qty 1

## 2016-06-29 MED ORDER — TRAMADOL HCL 50 MG PO TABS: 50.0000 mg | ORAL_TABLET | Freq: Four times a day (QID) | ORAL | 0 refills | Status: DC | PRN

## 2016-06-29 MED ORDER — GENTAMICIN SULFATE 0.1 % EX OINT
TOPICAL_OINTMENT | Freq: Three times a day (TID) | CUTANEOUS | Status: DC
  Filled 2016-06-29: qty 15

## 2016-06-29 MED ORDER — TETRACAINE HCL 0.5 % OP SOLN
OPHTHALMIC | Status: AC
  Filled 2016-06-29: qty 2

## 2016-06-29 MED ORDER — ACETAMINOPHEN 500 MG PO TABS
1000.0000 mg | ORAL_TABLET | Freq: Once | ORAL | Status: AC
  Administered 2016-06-29: 1000 mg via ORAL
  Filled 2016-06-29: qty 2
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
## 2016-06-29 NOTE — Discharge Instructions (Signed)
Please return immediately if condition worsens. Please contact her primary physician or the physician you were given for referral. If you have any specialist physicians involved in her treatment and plan please also contact them. Thank you for using [facility] emergency Department. ° °

## 2016-06-29 NOTE — ED Notes (Signed)

## 2018-01-13 ENCOUNTER — Emergency Department: Payer: Self-pay

## 2018-01-13 ENCOUNTER — Encounter: Payer: Self-pay | Admitting: Emergency Medicine

## 2018-01-13 ENCOUNTER — Other Ambulatory Visit: Payer: Self-pay

## 2018-01-13 ENCOUNTER — Emergency Department
Admission: EM | Admit: 2018-01-13 | Discharge: 2018-01-13 | Disposition: A | Discharge status: Home / Self Care | Payer: Self-pay | Attending: Emergency Medicine | Admitting: Emergency Medicine

## 2018-01-13 DIAGNOSIS — S80251A Superficial foreign body, right knee, initial encounter: Secondary | ICD-10-CM | POA: Insufficient documentation

## 2018-01-13 DIAGNOSIS — Z79899 Other long term (current) drug therapy: Secondary | ICD-10-CM | POA: Insufficient documentation

## 2018-01-13 DIAGNOSIS — S8001XA Contusion of right knee, initial encounter: Secondary | ICD-10-CM | POA: Insufficient documentation

## 2018-01-13 DIAGNOSIS — F17210 Nicotine dependence, cigarettes, uncomplicated: Secondary | ICD-10-CM | POA: Insufficient documentation

## 2018-01-13 DIAGNOSIS — Y939 Activity, unspecified: Secondary | ICD-10-CM | POA: Insufficient documentation

## 2018-01-13 DIAGNOSIS — Y92000 Kitchen of unspecified non-institutional (private) residence as  the place of occurrence of the external cause: Secondary | ICD-10-CM | POA: Insufficient documentation

## 2018-01-13 DIAGNOSIS — S81011A Laceration without foreign body, right knee, initial encounter: Secondary | ICD-10-CM | POA: Insufficient documentation

## 2018-01-13 DIAGNOSIS — W010XXA Fall on same level from slipping, tripping and stumbling without subsequent striking against object, initial encounter: Secondary | ICD-10-CM | POA: Insufficient documentation

## 2018-01-13 DIAGNOSIS — Y998 Other external cause status: Secondary | ICD-10-CM | POA: Insufficient documentation

## 2018-01-13 MED ORDER — CEPHALEXIN 500 MG PO CAPS: 500.0000 mg | ORAL_CAPSULE | Freq: Four times a day (QID) | ORAL | 0 refills | Status: AC

## 2018-01-13 MED ORDER — HYDROCODONE-ACETAMINOPHEN 5-325 MG PO TABS
1.0000 | ORAL_TABLET | ORAL | Status: AC
  Administered 2018-01-13: 1 via ORAL
  Filled 2018-01-13: qty 1

## 2018-01-13 MED ORDER — HYDROCODONE-ACETAMINOPHEN 5-325 MG PO TABS: 1.0000 | ORAL_TABLET | Freq: Four times a day (QID) | ORAL | 0 refills | Status: DC | PRN

## 2018-01-13 MED ORDER — LIDOCAINE-EPINEPHRINE 2 %-1:100000 IJ SOLN
30.0000 mL | Freq: Once | INTRAMUSCULAR | Status: AC
  Administered 2018-01-13: 30 mL via INTRADERMAL

## 2018-01-13 MED ORDER — ONDANSETRON 4 MG PO TBDP
4.0000 mg | ORAL_TABLET | Freq: Once | ORAL | Status: AC
  Administered 2018-01-13: 4 mg via ORAL
  Filled 2018-01-13: qty 1
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
## 2018-01-13 NOTE — ED Provider Notes (Signed)
[facility] EMERGENCY DEPARTMENT Provider Note   CSN: [id number] Arrival date & time: 01/13/18  2014     History   Chief Complaint Chief Complaint  Patient presents with  . Laceration    HPI Alexander Morgan is a 37 y.o. male.  Presents to the emergency department evaluation laceration to the right knee.  Patient states he stumbled, fell onto glass that was broken in his kitchen.  Patient states he had tempered glass from his stovetop that was broken on the kitchen floor and he fell onto this glass with his right knee.  Denies any other injury to his body.  He is having pain with ambulation along the right knee.  His tetanus is up-to-date.  Denies any lacerations or pain throughout his body.  HPI  Past Medical History:  Diagnosis Date  . Back pain     There are no active problems to display for this patient.   Past Surgical History:  Procedure Laterality Date  . LUMBAR SPINE SURGERY          Home Medications    Prior to Admission medications   Medication Sig Start Date End Date Taking? Authorizing Provider  acetaminophen (TYLENOL) 500 MG tablet Take 1,000 mg by mouth every 6 (six) hours as needed for moderate pain.    [provider]  cephALEXin (KEFLEX) 500 MG capsule Take 1 capsule (500 mg total) by mouth 4 (four) times daily for 7 days. 01/13/18 01/20/18  Evon Slack, PA-C  HYDROcodone-acetaminophen (NORCO) 5-325 MG tablet Take 1 tablet by mouth every 6 (six) hours as needed for moderate pain. 01/13/18   Evon Slack, PA-C  methocarbamol (ROBAXIN) 500 MG tablet Take 1 tablet (500 mg total) by mouth every 6 (six) hours as needed for muscle spasms. 01/05/15   Elson Areas, PA-C  methylPREDNISolone (MEDROL DOSEPAK) 4 MG TBPK tablet Take as directed 06/27/16   Demetrios Loll T, PA-C  predniSONE (DELTASONE) 10 MG tablet Take 1 tablet (10 mg total) by mouth daily. 6,5,4,3,2,1 six day taper 06/27/15   Evon Slack, PA-C  traMADol  (ULTRAM) 50 MG tablet Take 1 tablet (50 mg total) by mouth every 6 (six) hours as needed. 06/29/16   Jennye Moccasin, MD    Family History No family history on file.  Social History Social History   Tobacco Use  . Smoking status: Current Some Day Smoker    Packs/day: 1.00    Types: Cigarettes  . Smokeless tobacco: Never Used  Substance Use Topics  . Alcohol use: No  . Drug use: No     Allergies   Mobic [meloxicam] and Naproxen   Review of Systems Review of Systems  Constitutional: Negative for fever.  Musculoskeletal: Positive for arthralgias and gait problem. Negative for joint swelling.  Skin: Positive for wound.  Neurological: Negative for dizziness, weakness, light-headedness, numbness and headaches.     Physical Exam Updated Vital Signs BP (!) 147/82 (BP Location: Left Arm)   Pulse 82   Temp 98 F (36.7 C) (Oral)   Resp 18   Ht 5\' 6"  (1.676 m)   Wt 86.2 kg (190 lb)   SpO2 100%   BMI 30.67 kg/m   Physical Exam  Constitutional: He is oriented to person, place, and time. He appears well-developed and well-nourished.  HENT:  Head: Normocephalic and atraumatic.  Eyes: Conjunctivae are normal.  Neck: Normal range of motion.  Cardiovascular: Normal rate.  Pulmonary/Chest: Effort normal. No respiratory distress.  Musculoskeletal:  Patient able to actively straight leg raise with the right knee.  He has stellate laceration to the anterior patella with no defect in the quad tendon or patella tendon.  Knee is stable to valgus varus stress testing.  No effusion.  No warmth erythema.  No visible foreign body.  Stellate laceration is 1 x 1 cm and below that there is superficial soft tissue abrasions.  Neurological: He is alert and oriented to person, place, and time.  Skin: Skin is warm. No rash noted.  Psychiatric: He has a normal mood and affect. His behavior is normal. Thought content normal.     ED Treatments / Results  Labs (all labs ordered are listed,  but only abnormal results are displayed) Labs Reviewed - No data to display  EKG None  Radiology No results found.  Procedures .Marland KitchenLaceration Repair Date/Time: 01/13/2018 10:52 PM Performed by: Evon Slack, PA-C Authorized by: Evon Slack, PA-C   Consent:    Consent obtained:  Verbal   Consent given by:  Patient   Risks discussed:  Infection, need for additional repair, poor cosmetic result and poor wound healing   Alternatives discussed:  No treatment Anesthesia (see MAR for exact dosages):    Anesthesia method:  Local infiltration   Local anesthetic:  Lidocaine 1% WITH epi Laceration details:    Location:  Leg   Leg location:  R knee   Length (cm):  1   Depth (mm):  3 Repair type:    Repair type:  Simple Pre-procedure details:    Preparation:  Patient was prepped and draped in usual sterile fashion Exploration:    Wound exploration: wound explored through full range of motion     Wound extent: foreign bodies/material     Foreign bodies/material:  Small piece of glass removed from knee.  No other visible or palpable foreign body. Treatment:    Area cleansed with:  Betadine   Amount of cleaning:  Standard   Irrigation solution:  Sterile saline   Irrigation method:  Pressure wash   Visualized foreign bodies/material removed: yes   Skin repair:    Repair method:  Sutures   Suture size:  5-0   Suture material:  Nylon   Suture technique:  Horizontal mattress and simple interrupted   Number of sutures:  3 Approximation:    Approximation:  Close Post-procedure details:    Dressing:  Bulky dressing and antibiotic ointment   Patient tolerance of procedure:  Tolerated well, no immediate complications   (including critical care time)  Medications Ordered in ED Medications  lidocaine-EPINEPHrine (XYLOCAINE W/EPI) 2 %-1:100000 (with pres) injection 30 mL (has no administration in time range)  HYDROcodone-acetaminophen (NORCO/VICODIN) 5-325 MG per tablet 1 tablet  (1 tablet Oral Given 01/13/18 2104)     Initial Impression / Assessment and Plan / ED Course  I have reviewed the triage vital signs and the nursing notes.  Pertinent labs & imaging results that were available during my care of the patient were reviewed by me and considered in my medical decision making (see chart for details).     37 year old male with fall onto his right anterior knee onto glass.  Patient states he removed some glass at home from his right knee laceration.  Small piece was removed with irrigation today.  X-rays show no evidence of radiopaque foreign body.  He appears to have no tendon deficits and no evidence of acute bony abnormality after reviewing x-rays.  Patient is given crutches to  help with ambulation.  He will avoid any kneeling of the laceration site.  Laceration is repaired with 1 horizontal mattress suture and 2 simple sutures with 5-0 nylon.  He will follow-up in 8 to 10 days for suture removal.  He is placed on prophylactic antibiotics.  His tetanus is up-to-date.  He is given Norco for pain.  He is educated on signs and symptoms return to ED for.  Final Clinical Impressions(s) / ED Diagnoses   Final diagnoses:  Knee laceration, right, initial encounter  Contusion of right knee, initial encounter    ED Discharge Orders        Ordered    cephALEXin (KEFLEX) 500 MG capsule  4 times daily     01/13/18 2126    HYDROcodone-acetaminophen (NORCO) 5-325 MG tablet  Every 6 hours PRN     01/13/18 2126       Ronnette JuniperGaines, Thomas C, PA-C 01/13/18 2254    Sharman CheekStafford, Phillip, MD 01/14/18 [phone]

## 2018-01-13 NOTE — Discharge Instructions (Addendum)
Please follow-up with primary care office, ER or walk-in clinic in 10 days for suture removal and Steri-Strip application.  Keep area clean and do not submerge underwater.  You may shower.  Apply antibiotic ointment daily and keep covered with Band-Aid.  Take antibiotics as prescribed.  Use pain medication as needed for severe pain.

## 2020-08-23 ENCOUNTER — Emergency Department: Payer: Self-pay

## 2020-08-23 ENCOUNTER — Emergency Department
Admission: EM | Admit: 2020-08-23 | Discharge: 2020-08-23 | Disposition: A | Discharge status: Home / Self Care | Payer: Self-pay | Attending: Emergency Medicine | Admitting: Emergency Medicine

## 2020-08-23 ENCOUNTER — Other Ambulatory Visit: Payer: Self-pay

## 2020-08-23 DIAGNOSIS — F17210 Nicotine dependence, cigarettes, uncomplicated: Secondary | ICD-10-CM | POA: Insufficient documentation

## 2020-08-23 DIAGNOSIS — L03211 Cellulitis of face: Secondary | ICD-10-CM | POA: Insufficient documentation

## 2020-08-23 LAB — CBC WITH DIFFERENTIAL/PLATELET
Abs Immature Granulocytes: 0.03 10*3/uL (ref 0.00–0.07)
Basophils Absolute: 0.1 10*3/uL (ref 0.0–0.1)
Basophils Relative: 1 %
Eosinophils Absolute: 0.1 10*3/uL (ref 0.0–0.5)
Eosinophils Relative: 1 %
HCT: 40.2 % (ref 39.0–52.0)
Hemoglobin: 13.2 g/dL (ref 13.0–17.0)
Immature Granulocytes: 0 %
Lymphocytes Relative: 18 %
Lymphs Abs: 2.2 10*3/uL (ref 0.7–4.0)
MCH: 27.9 pg (ref 26.0–34.0)
MCHC: 32.8 g/dL (ref 30.0–36.0)
MCV: 85 fL (ref 80.0–100.0)
Monocytes Absolute: 1 10*3/uL (ref 0.1–1.0)
Monocytes Relative: 9 %
Neutro Abs: 8.4 10*3/uL — ABNORMAL HIGH (ref 1.7–7.7)
Neutrophils Relative %: 71 %
Platelets: 216 10*3/uL (ref 150–400)
RBC: 4.73 MIL/uL (ref 4.22–5.81)
RDW: 13.3 % (ref 11.5–15.5)
WBC: 11.8 10*3/uL — ABNORMAL HIGH (ref 4.0–10.5)
nRBC: 0 % (ref 0.0–0.2)

## 2020-08-23 LAB — BASIC METABOLIC PANEL
Anion gap: 7 (ref 5–15)
BUN: 10 mg/dL (ref 6–20)
CO2: 28 mmol/L (ref 22–32)
Calcium: 9.6 mg/dL (ref 8.9–10.3)
Chloride: 100 mmol/L (ref 98–111)
Creatinine, Ser: 0.63 mg/dL (ref 0.61–1.24)
GFR, Estimated: >60 mL/min (ref >60)
Glucose, Bld: 132 mg/dL — ABNORMAL HIGH (ref 70–99)
Potassium: 4.9 mmol/L (ref 3.5–5.1)
Sodium: 135 mmol/L (ref 135–145)

## 2020-08-23 LAB — LACTIC ACID, PLASMA: Lactic Acid, Venous: 1.6 mmol/L (ref 0.5–1.9)

## 2020-08-23 MED ORDER — TRAMADOL HCL 50 MG PO TABS: 50.0000 mg | ORAL_TABLET | Freq: Two times a day (BID) | ORAL | 0 refills | Status: DC | PRN

## 2020-08-23 MED ORDER — PIPERACILLIN-TAZOBACTAM 3.375 G IVPB 30 MIN
3.3750 g | Freq: Once | INTRAVENOUS | Status: AC
  Administered 2020-08-23: 3.375 g via INTRAVENOUS
  Filled 2020-08-23: qty 50

## 2020-08-23 MED ORDER — MORPHINE SULFATE (PF) 4 MG/ML IV SOLN
4.0000 mg | Freq: Once | INTRAVENOUS | Status: AC
  Administered 2020-08-23: 4 mg via INTRAVENOUS
  Filled 2020-08-23: qty 1

## 2020-08-23 MED ORDER — AMOXICILLIN-POT CLAVULANATE 875-125 MG PO TABS: 1.0000 | ORAL_TABLET | Freq: Two times a day (BID) | ORAL | 0 refills | Status: DC

## 2020-08-23 MED ORDER — METHYLPREDNISOLONE SODIUM SUCC 125 MG IJ SOLR
60.0000 mg | Freq: Once | INTRAMUSCULAR | Status: DC
  Filled 2020-08-23: qty 2

## 2020-08-23 MED ORDER — ONDANSETRON HCL 4 MG/2ML IJ SOLN
4.0000 mg | Freq: Once | INTRAMUSCULAR | Status: AC
  Administered 2020-08-23: 4 mg via INTRAVENOUS
  Filled 2020-08-23: qty 2

## 2020-08-23 MED ORDER — METHYLPREDNISOLONE SODIUM SUCC 125 MG IJ SOLR
60.0000 mg | Freq: Once | INTRAMUSCULAR | Status: AC
Start: 2020-08-23 — End: 2020-08-23
  Administered 2020-08-23: 60 mg via INTRAVENOUS

## 2020-08-23 MED ORDER — IOHEXOL 300 MG/ML  SOLN
75.0000 mL | Freq: Once | INTRAMUSCULAR | Status: AC | PRN
  Administered 2020-08-23: 75 mL via INTRAVENOUS
  Filled 2020-08-23: qty 75

## 2020-08-23 NOTE — ED Notes (Signed)
Pt taken to CT.

## 2020-08-23 NOTE — ED Provider Notes (Signed)
[facility] Emergency Department Provider Note   ____________________________________________   Event Date/Time   First MD Initiated Contact with Patient 08/23/20 [phone]     (approximate)  I have reviewed the triage vital signs and the nursing notes.   HISTORY  Chief Complaint Eye Pain    HPI Alexander Morgan is a 40 y.o. male patient presents with left facial swelling.  Patient the incident started 2 days ago when he tried to ruptured a papular lesion inferior right nostril.  Patient states not able to express any material from the ruptured lesion.  States pain has increased from a 3/10 to a 7/10 today.  Described the pain as "achy".  Swelling has ascended to the inferior left orbital area.  No palliative measure for complaint.         Past Medical History:  Diagnosis Date  . Back pain     There are no problems to display for this patient.   Past Surgical History:  Procedure Laterality Date  . LUMBAR SPINE SURGERY      Prior to Admission medications   Medication Sig Start Date End Date Taking? Authorizing Provider  amoxicillin-clavulanate (AUGMENTIN) 875-125 MG tablet Take 1 tablet by mouth 2 (two) times daily. 08/23/20  Yes Joni Reining, PA-C  traMADol (ULTRAM) 50 MG tablet Take 1 tablet (50 mg total) by mouth every 12 (twelve) hours as needed. 08/23/20  Yes Joni Reining, PA-C  acetaminophen (TYLENOL) 500 MG tablet Take 1,000 mg by mouth every 6 (six) hours as needed for moderate pain.    [provider]  HYDROcodone-acetaminophen (NORCO) 5-325 MG tablet Take 1 tablet by mouth every 6 (six) hours as needed for moderate pain. 01/13/18   Evon Slack, PA-C  methocarbamol (ROBAXIN) 500 MG tablet Take 1 tablet (500 mg total) by mouth every 6 (six) hours as needed for muscle spasms. 01/05/15   Elson Areas, PA-C  methylPREDNISolone (MEDROL DOSEPAK) 4 MG TBPK tablet Take as directed 06/27/16   Demetrios Loll T, PA-C  predniSONE  (DELTASONE) 10 MG tablet Take 1 tablet (10 mg total) by mouth daily. 6,5,4,3,2,1 six day taper 06/27/15   Evon Slack, PA-C  traMADol (ULTRAM) 50 MG tablet Take 1 tablet (50 mg total) by mouth every 6 (six) hours as needed. 06/29/16   Jennye Moccasin, MD    Allergies Mobic [meloxicam] and Naproxen  No family history on file.  Social History Social History   Tobacco Use  . Smoking status: Current Some Day Smoker    Packs/day: 1.00    Types: Cigarettes  . Smokeless tobacco: Never Used  Vaping Use  . Vaping Use: Never used  Substance Use Topics  . Alcohol use: No  . Drug use: No    Review of Systems Constitutional: No fever/chills Eyes: No visual changes. ENT: No sore throat. Cardiovascular: Denies chest pain. Respiratory: Denies shortness of breath. Gastrointestinal: No abdominal pain.  No nausea, no vomiting.  No diarrhea.  No constipation. Genitourinary: Negative for dysuria. Musculoskeletal: Negative for back pain. Skin: Negative for rash.  Edema and erythema left facial area. Neurological: Negative for headaches, focal weakness or numbness. Allergic/Immunilogical: Mobic and naproxen.  ____________________________________________   PHYSICAL EXAM:  VITAL SIGNS: ED Triage Vitals  Enc Vitals Group     BP 08/23/20 0907 (!) 138/97     Pulse Rate 08/23/20 0907 98     Resp 08/23/20 0907 18     Temp 08/23/20 0907 98.6 F (37 C)  Temp src --      SpO2 08/23/20 0907 97 %     Weight --      Height --      Head Circumference --      Peak Flow --      Pain Score 08/23/20 0857 3     Pain Loc --      Pain Edu? --      Excl. in GC? --    Constitutional: Alert and oriented. Well appearing and in no acute distress. Eyes: Conjunctivae are normal. PERRL. EOMI. Head: Atraumatic. Nose: No congestion/rhinnorhea. Mouth/Throat: Mucous membranes are moist.  Oropharynx non-erythematous. Neck: No stridor. Hematological/Lymphatic/Immunilogical: No cervical  lymphadenopathy. Cardiovascular: Normal rate, regular rhythm. Grossly normal heart sounds.  Good peripheral circulation. Respiratory: Normal respiratory effort.  No retractions. Lungs CTAB. Neurologic:  Normal speech and language. No gross focal neurologic deficits are appreciated. No gait instability. Skin: Left facial edema and erythema.  Psychiatric: Mood and affect are normal. Speech and behavior are normal.  ____________________________________________   LABS (all labs ordered are listed, but only abnormal results are displayed)  Labs Reviewed  BASIC METABOLIC PANEL - Abnormal; Notable for the following components:      Result Value   Glucose, Bld 132 (*)    All other components within normal limits  CBC WITH DIFFERENTIAL/PLATELET - Abnormal; Notable for the following components:   WBC 11.8 (*)    Neutro Abs 8.4 (*)    All other components within normal limits  CULTURE, BLOOD (ROUTINE X 2)  CULTURE, BLOOD (ROUTINE X 2)  LACTIC ACID, PLASMA   ____________________________________________  EKG   ____________________________________________  RADIOLOGY I, Joni Reining, personally viewed and evaluated these images (plain radiographs) as part of my medical decision making, as well as reviewing the written report by the radiologist.  ED MD interpretation:    Official radiology report(s): CT Maxillofacial W Contrast  Result Date: 08/23/2020 CLINICAL DATA:  Cellulitis, left facial swelling EXAM: CT MAXILLOFACIAL WITH CONTRAST TECHNIQUE: Multidetector CT imaging of the maxillofacial structures was performed with intravenous contrast. Multiplanar CT image reconstructions were also generated. CONTRAST:  27mL OMNIPAQUE IOHEXOL 300 MG/ML  SOLN COMPARISON:  2017 FINDINGS: Osseous: Multifocal tooth decay. Periapical lucency about the posterior left maxillary molar. Temporomandibular joints are unremarkable. Included upper spine cervical spine is unremarkable. Orbits: Left periorbital  soft tissue swelling. No intraorbital extension. Sinuses: Mild paranasal sinus mucosal thickening. Mastoid air cells are clear. Soft tissues: Left periorbital, paranasal, and facial soft tissue swelling. This is greatest at the level of the maxilla and mandible. There is no evidence of soft tissue abscess. Asymmetric nonenlarged left level 1 and 2 lymph nodes are likely reactive. Limited intracranial: No abnormal enhancement. IMPRESSION: Inflammatory changes of the lower left face extending to the paranasal and periorbital tissues. No postseptal extension. No evidence of abscess. Dental/periodontal disease is present and may be the source of inflammation. Electronically Signed   By: Guadlupe Spanish M.D.   On: 08/23/2020 10:38    ____________________________________________   PROCEDURES  Procedure(s) performed (including Critical Care):  Procedures   ____________________________________________   INITIAL IMPRESSION / ASSESSMENT AND PLAN / ED COURSE  As part of my medical decision making, I reviewed the following data within the electronic MEDICAL RECORD NUMBER         Patient presents with edema and erythema of the left facial area.  Discussed CT findings with patient showed that the cellulitis secondary to dental disease.  Patient given IV antibiotics and will  switch over to oral upon discharge.  Advised patient to follow discharge care instruction and follow-up with dentist as soon as possible.      ____________________________________________   FINAL CLINICAL IMPRESSION(S) / ED DIAGNOSES  Final diagnoses:  Facial cellulitis     ED Discharge Orders         Ordered    amoxicillin-clavulanate (AUGMENTIN) 875-125 MG tablet  2 times daily        08/23/20 1125    traMADol (ULTRAM) 50 MG tablet  Every 12 hours PRN        08/23/20 1125          *Please note:  Alexander Morgan was evaluated in Emergency Department on 08/23/2020 for the symptoms described in the history of present  illness. He was evaluated in the context of the global COVID-19 pandemic, which necessitated consideration that the patient might be at risk for infection with the SARS-CoV-2 virus that causes COVID-19. Institutional protocols and algorithms that pertain to the evaluation of patients at risk for COVID-19 are in a state of rapid change based on information released by regulatory bodies including the CDC and federal and state organizations. These policies and algorithms were followed during the patient's care in the ED.  Some ED evaluations and interventions may be delayed as a result of limited staffing during and the pandemic.*   Note:  This document was prepared using Dragon voice recognition software and may include unintentional dictation errors.    Joni Reining, PA-C 08/23/20 1129    Chesley Noon, MD 08/23/20 1329

## 2020-08-23 NOTE — Discharge Instructions (Addendum)
Your facial cellulitis secondary to periodontal disease.  Follow discharge care instructions and take medication as directed.  Seek dental care as soon as possible from list of dental clinics in your discharge care instructions. OPTIONS FOR DENTAL FOLLOW UP CARE  [place] Department of Health and Human Services - Local Safety Net Dental Clinics [email or web address]   [facility] [phone])  [facility] [phone])  [place] [phone] ext [phone])  [facility] [phone])  [facility] [phone]) This clinic caters to the indigent population and is on a lottery system. Location: [employer] [facility], [employer], [address] [place] Clinic Hours: Wednesdays from 6pm - 9pm, patients seen by a lottery system. For dates, call or go to [email or web address] Services: Cleanings, fillings and simple extractions. Payment Options: DENTAL WORK IS FREE OF CHARGE. Bring proof of income or support. Best way to get seen: Arrive at 5:15 pm - this is a lottery, NOT first come/first serve, so arriving earlier will not increase your chances of being seen.     [facility] [phone] Select option 1 for emergencies   Location: [facility], [place], [address] [place] Clinic Hours: No walk-ins accepted - call the day before to schedule an appointment. Check in times are 9:30 am and 1:30 pm. Services: Simple extractions, temporary fillings, pulpectomy/pulp debridement, uncomplicated abscess drainage. Payment Options: PAYMENT IS DUE AT THE TIME OF SERVICE.  Fee is usually $100-200, additional surgical procedures (e.g. abscess drainage) may be extra. Cash, checks, Visa/MasterCard accepted.  Can file Medicaid if patient is covered for dental - patient should call case worker to check. No discount  for [facility] patients. Best way to get seen: MUST call the day before and get onto the schedule. Can usually be seen the next 1-2 days. No walk-ins accepted.     [facility] [phone]   Location: [facility], [address], [place] Clinic Hours: M, W, Th, F 8am or 1:30pm, Tues 9a or 1:30 - first come/first served. Services: Simple extractions, temporary fillings, uncomplicated abscess drainage.  You do not need to be an [facility] resident. Payment Options: PAYMENT IS DUE AT THE TIME OF SERVICE. Dental insurance, otherwise sliding scale - bring proof of income or support. Depending on income and treatment needed, cost is usually $50-200. Best way to get seen: Arrive early as it is first come/first served.     [facility] [phone]   Location: [address] Clinic Hours: Mon-Thu 8a-5p Services: Most basic dental services including extractions and fillings. Payment Options: PAYMENT IS DUE AT THE TIME OF SERVICE. Sliding scale, up to 50% off - bring proof if income or support. Medicaid with dental option accepted. Best way to get seen: Call to schedule an appointment, can usually be seen within 2 weeks OR they will try to see walk-ins - show up at 8a or 2p (you may have to wait).     [facility] [phone] [place] RESIDENTS ONLY   Location: [facility], [address], [place], [place] [address] Clinic Hours: By appointment only. Monday - Thursday 8am-5pm, Friday 8am-12pm Services: Cleanings, fillings, extractions. Payment Options: PAYMENT IS DUE AT THE TIME OF SERVICE. Cash, Visa or MasterCard. Sliding scale - $30 minimum per service. Best way to get seen: Come in to office, complete packet and make an appointment - need proof of income or support  monies for each household member and proof of [facility] residence. Usually  takes about a month to get in.     [facility] [phone]   Location: [address] [facility] Clinic Hours: Walk-in Urgent Care Dental Services are offered Monday-Friday mornings only. The numbers of emergencies accepted daily is limited to the number of providers available. Maximum 15 - Mondays, Wednesdays & Thursdays Maximum 10 - Tuesdays & Fridays Services: You do not need to be a [facility] resident to be seen for a dental emergency. Emergencies are defined as pain, swelling, abnormal bleeding, or dental trauma. Walkins will receive x-rays if needed. NOTE: Dental cleaning is not an emergency. Payment Options: PAYMENT IS DUE AT THE TIME OF SERVICE. Minimum co-pay is $40.00 for uninsured patients. Minimum co-pay is $3.00 for Medicaid with dental coverage. Dental Insurance is accepted and must be presented at time of visit. Medicare does not cover dental. Forms of payment: Cash, credit card, checks. Best way to get seen: If not previously registered with the clinic, walk-in dental registration begins at 7:15 am and is on a first come/first serve basis. If previously registered with the clinic, call to make an appointment.     [facility] [phone] [place] RESIDENTS ONLY   Location: [address] [place], [place] Clinic Hours: Mon-Thu 10a-2p Services: Extractions only! Payment Options: FREE (donations accepted) - bring proof of income or support Best way to get seen: Call and schedule an appointment OR come at 8am on the 1st Monday of every month (except for holidays) when it is first come/first served.     [facility] [phone]   Location: [address] [place], [place] Clinic Hours: Friday mornings Services, Payment Options, Best way to get seen: Call for info

## 2020-08-23 NOTE — ED Triage Notes (Signed)
Pt comes with c/o left eye pain and swelling. Pt states  He noticed it two days ago. Pt has redness and swelling. Pt denies any blurry vision. Pt states some discharge.

## 2020-08-24 LAB — CULTURE, BLOOD (ROUTINE X 2)

## 2020-08-26 LAB — CULTURE, BLOOD (ROUTINE X 2)

## 2020-08-27 LAB — CULTURE, BLOOD (ROUTINE X 2)

## 2020-08-28 LAB — CULTURE, BLOOD (ROUTINE X 2)
Culture: NO GROWTH
Culture: NO GROWTH
Special Requests: ADEQUATE

## 2021-01-01 ENCOUNTER — Emergency Department
Admission: EM | Admit: 2021-01-01 | Discharge: 2021-01-01 | Disposition: A | Discharge status: Home / Self Care | Payer: Self-pay | Attending: Emergency Medicine | Admitting: Emergency Medicine

## 2021-01-01 ENCOUNTER — Other Ambulatory Visit: Payer: Self-pay

## 2021-01-01 DIAGNOSIS — F17210 Nicotine dependence, cigarettes, uncomplicated: Secondary | ICD-10-CM | POA: Insufficient documentation

## 2021-01-01 DIAGNOSIS — S0181XA Laceration without foreign body of other part of head, initial encounter: Secondary | ICD-10-CM | POA: Insufficient documentation

## 2021-01-01 DIAGNOSIS — W208XXA Other cause of strike by thrown, projected or falling object, initial encounter: Secondary | ICD-10-CM | POA: Insufficient documentation

## 2021-01-01 DIAGNOSIS — S0101XA Laceration without foreign body of scalp, initial encounter: Secondary | ICD-10-CM

## 2021-01-01 MED ORDER — LIDOCAINE-PRILOCAINE 2.5-2.5 % EX CREA
TOPICAL_CREAM | Freq: Once | CUTANEOUS | Status: AC
  Administered 2021-01-01: 1 via TOPICAL
  Filled 2021-01-01: qty 5

## 2021-01-01 NOTE — ED Provider Notes (Signed)
[facility] Emergency Department Provider Note  ____________________________________________  Time seen: Approximately 3:44 PM  I have reviewed the triage vital signs and the nursing notes.   HISTORY  Chief Complaint Head Laceration    HPI Alexander Morgan is a 40 y.o. male with no significant past medical history who was in his usual state of health until today when he inadvertently dropped a metal file onto his forehead.  Caused some bleeding to the area.  Was not dazed, no loss of consciousness, no neck pain.  No paresthesias or vision changes.  Complains of pain just in the forehead where the laceration is.  Reports that his tetanus vaccine is up-to-date from a year ago.    Past Medical History:  Diagnosis Date   Back pain      There are no problems to display for this patient.    Past Surgical History:  Procedure Laterality Date   LUMBAR SPINE SURGERY       Prior to Admission medications   Medication Sig Start Date End Date Taking? Authorizing Provider  acetaminophen (TYLENOL) 500 MG tablet Take 1,000 mg by mouth every 6 (six) hours as needed for moderate pain.    [provider]  methocarbamol (ROBAXIN) 500 MG tablet Take 1 tablet (500 mg total) by mouth every 6 (six) hours as needed for muscle spasms. 01/05/15   Elson Areas, PA-C  traMADol (ULTRAM) 50 MG tablet Take 1 tablet (50 mg total) by mouth every 12 (twelve) hours as needed. 08/23/20   Joni Reining, PA-C     Allergies Mobic [meloxicam] and Naproxen   No family history on file.  Social History Social History   Tobacco Use   Smoking status: Some Days    Packs/day: 1.00    Pack years: 0.00    Types: Cigarettes   Smokeless tobacco: Never  Vaping Use   Vaping Use: Never used  Substance Use Topics   Alcohol use: No   Drug use: No    Review of Systems  Constitutional:   No fever or chills.  ENT:   No sore throat. No rhinorrhea. Cardiovascular:   No chest  pain or syncope. Respiratory:   No dyspnea or cough. Gastrointestinal:   Negative for abdominal pain, vomiting and diarrhea.  Musculoskeletal:   Negative for focal pain or swelling All other systems reviewed and are negative except as documented above in ROS and HPI.  ____________________________________________   PHYSICAL EXAM:  VITAL SIGNS: ED Triage Vitals  Enc Vitals Group     BP 01/01/21 1320 (!) 158/107     Pulse Rate 01/01/21 1320 (!) 117     Resp 01/01/21 1320 18     Temp 01/01/21 1320 98.6 F (37 C)     Temp Source 01/01/21 1320 Oral     SpO2 01/01/21 1320 98 %     Weight 01/01/21 1321 190 lb (86.2 kg)     Height 01/01/21 1321 5\' 6"  (1.676 m)     Head Circumference --      Peak Flow --      Pain Score 01/01/21 1321 0     Pain Loc --      Pain Edu? --      Excl. in GC? --     Vital signs reviewed, nursing assessments reviewed.   Constitutional:   Alert and oriented. Non-toxic appearance. Eyes:   Conjunctivae are normal. EOMI. ENT      Head:   Normocephalic with 2 cm  stellate laceration on the forehead.  Hemostatic.Marland Kitchen      Mouth/Throat:   MMM      Neck:   No meningismus. Full ROM.  No midline spinal tenderness Hematological/Lymphatic/Immunilogical:   No cervical lymphadenopathy. Cardiovascular:   RRR.  Respiratory:   Normal respiratory effort without tachypnea/retractions. Musculoskeletal:   Normal range of motion in all extremities.  No edema. Neurologic:   Normal speech and language.  Motor grossly intact. No acute focal neurologic deficits are appreciated.  Skin:    Skin is warm, dry with forehead laceration as above, no other injuries..  ____________________________________________    LABS (pertinent positives/negatives) (all labs ordered are listed, but only abnormal results are displayed) Labs Reviewed - No data to display ____________________________________________   EKG  ____________________________________________    RADIOLOGY  No  results found.  ____________________________________________   PROCEDURES .Marland KitchenLaceration Repair  Date/Time: 01/01/2021 3:49 PM Performed by: Sharman Cheek, MD Authorized by: Sharman Cheek, MD   Consent:    Consent obtained:  Verbal   Consent given by:  Patient   Risks discussed:  Infection, pain, retained foreign body, poor cosmetic result and poor wound healing Anesthesia:    Anesthesia method:  Topical application   Topical anesthetic:  EMLA cream Laceration details:    Location:  Scalp   Scalp location:  Frontal   Length (cm):  2 Pre-procedure details:    Preparation:  Patient was prepped and draped in usual sterile fashion Exploration:    Hemostasis achieved with:  Direct pressure   Wound exploration: entire depth of wound visualized     Wound extent: no fascia violation noted, no foreign bodies/material noted, no muscle damage noted, no underlying fracture noted and no vascular damage noted     Contaminated: no   Treatment:    Area cleansed with:  Saline and povidone-iodine   Amount of cleaning:  Extensive   Irrigation solution:  Sterile saline   Irrigation method:  Pressure wash   Visualized foreign bodies/material removed: no     Debridement:  None   Undermining:  None   Scar revision: no   Skin repair:    Repair method:  Sutures   Suture size:  4-0   Wound skin closure material used: vicryl.   Suture technique:  Simple interrupted   Number of sutures:  1 Approximation:    Approximation:  Close Repair type:    Repair type:  Simple Post-procedure details:    Dressing:  Sterile dressing   Procedure completion:  Tolerated well, no immediate complications  ____________________________________________  CLINICAL IMPRESSION / ASSESSMENT AND PLAN / ED COURSE  Pertinent labs & imaging results that were available during my care of the patient were reviewed by me and considered in my medical decision making (see chart for details).  Alexander Morgan was  evaluated in Emergency Department on 01/01/2021 for the symptoms described in the history of present illness. He was evaluated in the context of the global COVID-19 pandemic, which necessitated consideration that the patient might be at risk for infection with the SARS-CoV-2 virus that causes COVID-19. Institutional protocols and algorithms that pertain to the evaluation of patients at risk for COVID-19 are in a state of rapid change based on information released by regulatory bodies including the CDC and federal and state organizations. These policies and algorithms were followed during the patient's care in the ED.   Patient presents with forehead laceration.  Low suspicion for intracranial hemorrhage or skull fracture.  Tetanus is up-to-date by patient report.  Wound care and repair performed, stable for discharge      ____________________________________________   FINAL CLINICAL IMPRESSION(S) / ED DIAGNOSES    Final diagnoses:  Laceration of scalp, initial encounter     ED Discharge Orders     None       Portions of this note were generated with dragon dictation software. Dictation errors may occur despite best attempts at proofreading.   Sharman Cheek, MD 01/01/21 1550

## 2021-01-01 NOTE — ED Triage Notes (Signed)
Pt here with a small laceration to his head after working on his car. Bleeding has stopped and is controled. Pt states besides his head being sore he is fine.

## 2021-12-15 ENCOUNTER — Emergency Department
Admission: EM | Admit: 2021-12-15 | Discharge: 2021-12-15 | Disposition: A | Discharge status: Home / Self Care | Payer: Self-pay | Attending: Emergency Medicine | Admitting: Emergency Medicine

## 2021-12-15 ENCOUNTER — Emergency Department: Payer: Self-pay

## 2021-12-15 ENCOUNTER — Other Ambulatory Visit: Payer: Self-pay

## 2021-12-15 DIAGNOSIS — Z0279 Encounter for issue of other medical certificate: Secondary | ICD-10-CM | POA: Insufficient documentation

## 2021-12-15 DIAGNOSIS — S93402A Sprain of unspecified ligament of left ankle, initial encounter: Secondary | ICD-10-CM | POA: Insufficient documentation

## 2021-12-15 DIAGNOSIS — Y9302 Activity, running: Secondary | ICD-10-CM | POA: Insufficient documentation

## 2021-12-15 DIAGNOSIS — X501XXA Overexertion from prolonged static or awkward postures, initial encounter: Secondary | ICD-10-CM | POA: Insufficient documentation

## 2021-12-15 NOTE — ED Provider Notes (Signed)
[facility] Provider Note    Event Date/Time   First MD Initiated Contact with Patient 12/15/21 1556     (approximate)   History   Chief Complaint: Medical Clearance   HPI  Alexander Morgan is a 41 y.o. male with no significant past medical history who is brought to the ED for medical evaluation after being apprehended by police.  Patient reports that while running from police and climbing over a fence, he feels like he stepped on his left foot wrong and started having left ankle pain.  Denies a sharp pop, no sharp pain but it is painful to stand on.  Pain is on the left lateral ankle radiating up.  Also complains of left shoulder pain.     Physical Exam   Triage Vital Signs: ED Triage Vitals [12/15/21 1606]  Enc Vitals Group     BP      Pulse      Resp      Temp      Temp src      SpO2      Weight 200 lb (90.7 kg)     Height 5\' 6"  (1.676 m)     Head Circumference      Peak Flow      Pain Score 5     Pain Loc      Pain Edu?      Excl. in GC?     Most recent vital signs: There were no vitals filed for this visit.  General: Awake, no distress.  CV:  Good peripheral perfusion.  Normal peripheral pulses.  Regular rate and rhythm. Resp:  Normal effort.  Abd:  No distention.  Other:  There is some bony tenderness at the posterior left medial malleolus.  Lateral malleolus is normal.  Patient exhibits pain with weightbearing.  No other bony tenderness.  Intact range of motion.  No dislocations.  He has forensic restraints in place.  There is scattered abrasions on the left leg.  Patient reports having a tetanus shot very recently, within the last few months.   ED Results / Procedures / Treatments   Labs (all labs ordered are listed, but only abnormal results are displayed) Labs Reviewed - No data to display   EKG    RADIOLOGY X-ray left ankle interpreted by me, appears normal.  Radiology report  reviewed   PROCEDURES:  Procedures   MEDICATIONS ORDERED IN ED: Medications - No data to display   IMPRESSION / MDM / ASSESSMENT AND PLAN / ED COURSE  I reviewed the triage vital signs and the nursing notes.                              Differential diagnosis includes, but is not limited to, left ankle sprain, left ankle fracture, contusion, muscle spasm  Patient's presentation is most consistent with acute complicated illness / injury requiring diagnostic workup.  Patient presents with left ankle pain, minor trauma mechanism consistent with an ankle sprain.  Otherwise he is nontoxic and well-appearing without significant trauma.  No other symptoms.  I suspect his tachycardia is due to anxiety and minor dehydration, is not having any chest pain or headache or shortness of breath currently.  X-ray obtained which is unremarkable.  Stable for discharge.       FINAL CLINICAL IMPRESSION(S) / ED DIAGNOSES   Final diagnoses:  Sprain of left ankle, unspecified ligament, initial encounter  Rx / DC Orders   ED Discharge Orders     None        Note:  This document was prepared using Dragon voice recognition software and may include unintentional dictation errors.   Sharman Cheek, MD 12/15/21 1626

## 2021-12-15 NOTE — ED Triage Notes (Addendum)
First Nurse Note:  Pt via EMS from police department. Pt ran from them and now pt c/o headache, neck, arms, back, and SOB after running. EMS reports clear lung sounds. 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 States he is refusing any types of sticks. Pt is A&Ox4 and NAD  97% on RA 142 HR  168/108

## 2021-12-15 NOTE — Discharge Instructions (Addendum)
Your xray of left ankle does not show any fractures.

## 2021-12-15 NOTE — ED Triage Notes (Signed)
See first nurse note. Pt arrives with EMS and PD for medical clearance after he ran from the police but was apprehended- pt has many vague complaints

## 2022-02-02 ENCOUNTER — Emergency Department
Admission: EM | Admit: 2022-02-02 | Discharge: 2022-02-02 | Disposition: A | Discharge status: Home / Self Care | Payer: Self-pay | Attending: Emergency Medicine | Admitting: Emergency Medicine

## 2022-02-02 ENCOUNTER — Encounter: Payer: Self-pay | Admitting: Emergency Medicine

## 2022-02-02 ENCOUNTER — Other Ambulatory Visit: Payer: Self-pay

## 2022-02-02 DIAGNOSIS — L03213 Periorbital cellulitis: Secondary | ICD-10-CM | POA: Insufficient documentation

## 2022-02-02 MED ORDER — SULFAMETHOXAZOLE-TRIMETHOPRIM 800-160 MG PO TABS: 1.0000 | ORAL_TABLET | Freq: Two times a day (BID) | ORAL | 0 refills | Status: DC

## 2022-02-02 MED ORDER — LIDOCAINE HCL (PF) 1 % IJ SOLN
1.2000 mL | Freq: Once | INTRAMUSCULAR | Status: AC
  Administered 2022-02-02: 1.2 mL
  Filled 2022-02-02: qty 5

## 2022-02-02 MED ORDER — HYDROCODONE-ACETAMINOPHEN 5-325 MG PO TABS: 1.0000 | ORAL_TABLET | Freq: Four times a day (QID) | ORAL | 0 refills | Status: AC | PRN

## 2022-02-02 MED ORDER — HYDROCODONE-ACETAMINOPHEN 5-325 MG PO TABS
1.0000 | ORAL_TABLET | Freq: Once | ORAL | Status: AC
  Administered 2022-02-02: 1 via ORAL
  Filled 2022-02-02: qty 1

## 2022-02-02 MED ORDER — CEFTRIAXONE SODIUM 1 G IJ SOLR
1.0000 g | Freq: Once | INTRAMUSCULAR | Status: AC
  Administered 2022-02-02: 1 g via INTRAMUSCULAR
  Filled 2022-02-02: qty 10

## 2022-02-02 NOTE — ED Triage Notes (Signed)
Pt via POV from home. Pt c/o L eye pain and swelling. States he noticed it 3 days ago and he busted it and now it is bigger. Swelling noted to the L eye and redness. Pt is A&OX4 and NAD

## 2022-02-02 NOTE — ED Provider Notes (Signed)
[facility] Provider Note    Event Date/Time   First MD Initiated Contact with Patient 02/02/22 1345     (approximate)   History   Facial Swelling (L Eye)   HPI  Alexander Morgan is a 41 y.o. male presents to the emergency department for treatment and evaluation of left eye swelling.  Symptoms started about 3 days ago with a small pustule in the left eyebrow.  Patient states that he squeezed the area and had a clear watery drainage.  Since that time, swelling has spread from the eyebrow to the eyelid, patient right side of his nose, area under his eye.  He denies any vision changes.  No pain with extraocular eye movement.  No fever.  Past Medical History:  Diagnosis Date   Back pain      Physical Exam   Triage Vital Signs: ED Triage Vitals  Enc Vitals Group     BP 02/02/22 1157 (!) 154/92     Pulse Rate 02/02/22 1157 (!) 103     Resp 02/02/22 1157 18     Temp 02/02/22 1157 98 F (36.7 C)     Temp Source 02/02/22 1157 Oral     SpO2 02/02/22 1157 98 %     Weight 02/02/22 1155 180 lb (81.6 kg)     Height 02/02/22 1155 5\' 6"  (1.676 m)     Head Circumference --      Peak Flow --      Pain Score 02/02/22 1155 10     Pain Loc --      Pain Edu? --      Excl. in GC? --     Most recent vital signs: Vitals:   02/02/22 1157  BP: (!) 154/92  Pulse: (!) 103  Resp: 18  Temp: 98 F (36.7 C)  SpO2: 98%    General: Awake, no distress.  CV:  Good peripheral perfusion.  Resp:  Normal effort.  Abd:  No distention.  Other:  Skin overlying the left eyebrow, left upper lid, inner canthus of the left eye, and just underneath the left lower eyelid erythematous and edematous.  Patient able to open eyelid.  Conjunctiva is clear.  No EOMI pain.  Pupil equal, round, reactive to light.   ED Results / Procedures / Treatments   Labs (all labs ordered are listed, but only abnormal results are displayed) Labs Reviewed - No data to display   EKG  Not  indicated   RADIOLOGY  Not indicated  I have independently reviewed and interpreted imaging as well as reviewed report from radiology.  PROCEDURES:  Critical Care performed: No  Procedures   MEDICATIONS ORDERED IN ED:  Medications  cefTRIAXone (ROCEPHIN) injection 1 g (1 g Intramuscular Given 02/02/22 1425)  lidocaine (PF) (XYLOCAINE) 1 % injection 1.2 mL (1.2 mLs Other Given 02/02/22 1424)  HYDROcodone-acetaminophen (NORCO/VICODIN) 5-325 MG per tablet 1 tablet (1 tablet Oral Given 02/02/22 1423)     IMPRESSION / MDM / ASSESSMENT AND PLAN / ED COURSE   I reviewed the triage vital signs and the nursing notes.  Differential diagnosis includes, but is not limited to: Preseptal cellulitis, orbital cellulitis, abscess  Patient's presentation is most consistent with acute, uncomplicated illness.  41 year old male presenting to the emergency department for treatment and evaluation of left eye swelling with surrounding erythema and edema that started out as a pustule in the left eyebrow.  See HPI for further details on exam, he does have some cellulitis  in the left eyebrow, upper and lower eyelid and over the nose at the inner canthus of the eye.Marland Kitchen  He has no indication of orbital cellulitis.  Plan will be to treat him with antibiotics and a short course of pain medication.  Strict ER return precautions.  He is to return if he is not improving over the next 2 to 3 days or sooner if his symptoms get worse.      FINAL CLINICAL IMPRESSION(S) / ED DIAGNOSES   Final diagnoses:  Preseptal cellulitis of right eye     Rx / DC Orders   ED Discharge Orders          Ordered    sulfamethoxazole-trimethoprim (BACTRIM DS) 800-160 MG tablet  2 times daily        02/02/22 1412    HYDROcodone-acetaminophen (NORCO/VICODIN) 5-325 MG tablet  Every 6 hours PRN        02/02/22 1412             Note:  This document was prepared using Dragon voice recognition software and may include  unintentional dictation errors.   Chinita Pester, FNP 02/02/22 1610    Sharyn Creamer, MD 02/02/22 1758

## 2022-02-02 NOTE — Discharge Instructions (Addendum)
If you develop pain with eye movement, fever, increase in area of redness, or swelling, please see primary care or return to the emergency department.

## 2022-02-02 NOTE — ED Notes (Signed)
Pt stated that he had a bump above his left eyebrow that he mashed which resulted in redness and swelling to the original area of the bump and down into his left eyelid.

## 2022-10-25 ENCOUNTER — Other Ambulatory Visit: Payer: Self-pay

## 2022-10-25 ENCOUNTER — Encounter (HOSPITAL_COMMUNITY): Payer: Self-pay

## 2022-10-25 DIAGNOSIS — F17210 Nicotine dependence, cigarettes, uncomplicated: Secondary | ICD-10-CM | POA: Insufficient documentation

## 2022-10-25 DIAGNOSIS — L02415 Cutaneous abscess of right lower limb: Secondary | ICD-10-CM | POA: Insufficient documentation

## 2022-10-25 NOTE — ED Triage Notes (Signed)
Pt has raised, hard area to right knee that he noticed 5 days ago, picked at it and popped in shower, redness and swelling present, appears to be abscess.

## 2022-10-26 ENCOUNTER — Emergency Department (HOSPITAL_COMMUNITY)
Admission: EM | Admit: 2022-10-26 | Discharge: 2022-10-26 | Disposition: A | Discharge status: Home / Self Care | Payer: Self-pay | Attending: Emergency Medicine | Admitting: Emergency Medicine

## 2022-10-26 DIAGNOSIS — L0291 Cutaneous abscess, unspecified: Secondary | ICD-10-CM

## 2022-10-26 MED ORDER — SULFAMETHOXAZOLE-TRIMETHOPRIM 800-160 MG PO TABS
1.0000 | ORAL_TABLET | Freq: Once | ORAL | Status: AC
  Administered 2022-10-26: 1 via ORAL
  Filled 2022-10-26: qty 1

## 2022-10-26 MED ORDER — SULFAMETHOXAZOLE-TRIMETHOPRIM 800-160 MG PO TABS: 1.0000 | ORAL_TABLET | Freq: Two times a day (BID) | ORAL | 0 refills | Status: AC

## 2022-10-26 MED ORDER — SULFAMETHOXAZOLE-TRIMETHOPRIM 800-160 MG PO TABS: 1.0000 | ORAL_TABLET | Freq: Two times a day (BID) | ORAL | 0 refills | Status: DC

## 2022-10-26 NOTE — ED Provider Notes (Signed)
[facility] Emergency Department Provider Note MRN:  [id number]  Arrival date & time: 10/26/22     Chief Complaint   Abscess (Right knee)   History of Present Illness   Alexander Morgan is a 42 y.o. year-old male with no pertinent past medical history presenting to the ED with chief complaint of abscess.  Abscess above the right knee for the past few days.  Popped in the shower recently.  Redness spreading around the site.  Still actively draining.  Denies fever.  Area is tender to palpation  Review of Systems  A thorough review of systems was obtained and all systems are negative except as noted in the HPI and PMH.   Patient's Health History    Past Medical History:  Diagnosis Date   Back pain     Past Surgical History:  Procedure Laterality Date   LUMBAR SPINE SURGERY      History reviewed. No pertinent family history.  Social History   Socioeconomic History   Marital status: Married    Spouse name: Not on file   Number of children: Not on file   Years of education: Not on file   Highest education level: Not on file  Occupational History   Not on file  Tobacco Use   Smoking status: Some Days    Packs/day: 1    Types: Cigarettes   Smokeless tobacco: Never  Vaping Use   Vaping Use: Never used  Substance and Sexual Activity   Alcohol use: No   Drug use: No   Sexual activity: Not on file  Other Topics Concern   Not on file  Social History Narrative   Not on file   Social Determinants of Health   Financial Resource Strain: Not on file  Food Insecurity: Not on file  Transportation Needs: Not on file  Physical Activity: Not on file  Stress: Not on file  Social Connections: Not on file  Intimate Partner Violence: Not on file     Physical Exam   Vitals:   10/25/22 2356  BP: 118/88  Pulse: 99  Resp: 18  Temp: 97.7 F (36.5 C)  SpO2: 100%    CONSTITUTIONAL: Well-appearing, NAD NEURO/PSYCH:  Alert and oriented x 3, no focal  deficits EYES:  eyes equal and reactive ENT/NECK:  no LAD, no JVD CARDIO: Regular rate, well-perfused, normal S1 and S2 PULM:  CTAB no wheezing or rhonchi GI/GU:  non-distended, non-tender MSK/SPINE:  No gross deformities, no edema SKIN: Circular area of erythema and induration above the right knee with central draining wound   *Additional and/or pertinent findings included in MDM below  Diagnostic and Interventional Summary    EKG Interpretation  Date/Time:    Ventricular Rate:    PR Interval:    QRS Duration:   QT Interval:    QTC Calculation:   R Axis:     Text Interpretation:         Labs Reviewed - No data to display  No orders to display    Medications  sulfamethoxazole-trimethoprim (BACTRIM DS) 800-160 MG per tablet 1 tablet (has no administration in time range)     Procedures  /  Critical Care Procedures  ED Course and Medical Decision Making  Initial Impression and Ddx Suspect patient's symptoms started with a folliculitis that became an abscess.  Popped quite a bit of purulence in the shower recently.  Wound is actively draining, the wound feels more indurated than fluctuant, doubt any significant benefit to  I&D given the actively draining wound.  Patient does need antibiotics for cellulitis.  Past medical/surgical history that increases complexity of ED encounter: None  Interpretation of Diagnostics Laboratory and/or imaging options to aid in the diagnosis/care of the patient were considered.  After careful history and physical examination, it was determined that there was no indication for diagnostics at this time.  Patient Reassessment and Ultimate Disposition/Management     Discharge  Patient management required discussion with the following services or consulting groups:  None  Complexity of Problems Addressed Acute complicated illness or Injury  Additional Data Reviewed and Analyzed Further history obtained from: Further history from  spouse/family member  Additional Factors Impacting ED Encounter Risk Prescriptions  Elmer Sow. Pilar Plate, MD [facility] Emergency Medicine [facility] [email or web address]  Final Clinical Impressions(s) / ED Diagnoses     ICD-10-CM   1. Abscess  L02.91       ED Discharge Orders          Ordered    sulfamethoxazole-trimethoprim (BACTRIM DS) 800-160 MG tablet  2 times daily        10/26/22 0038             Discharge Instructions Discussed with and Provided to Patient:    Discharge Instructions      You were evaluated in the Emergency Department and after careful evaluation, we did not find any emergent condition requiring admission or further testing in the hospital.  Your exam/testing today is overall reassuring.  Symptoms seem to be due to a cellulitis that spread from a small abscess.  Take the Bactrim antibiotics as prescribed.  Continue warm compresses.  Please return to the Emergency Department if you experience any worsening of your condition.   Thank you for allowing [place] to be a part of your care.      Sabas Sous, MD 10/26/22 [phone]

## 2022-10-26 NOTE — Discharge Instructions (Signed)
You were evaluated in the Emergency Department and after careful evaluation, we did not find any emergent condition requiring admission or further testing in the hospital.  Your exam/testing today is overall reassuring.  Symptoms seem to be due to a cellulitis that spread from a small abscess.  Take the Bactrim antibiotics as prescribed.  Continue warm compresses.  Please return to the Emergency Department if you experience any worsening of your condition.   Thank you for allowing [place] to be a part of your care.

## 2022-11-20 ENCOUNTER — Emergency Department (HOSPITAL_COMMUNITY): Payer: Self-pay

## 2022-11-20 ENCOUNTER — Inpatient Hospital Stay (HOSPITAL_COMMUNITY)
Admission: EM | Admit: 2022-11-20 | Discharge: 2022-11-22 | DRG: 917 | Disposition: A | Discharge status: Home / Self Care | Payer: Self-pay | Attending: Family Medicine | Admitting: Family Medicine

## 2022-11-20 DIAGNOSIS — J690 Pneumonitis due to inhalation of food and vomit: Secondary | ICD-10-CM | POA: Diagnosis present

## 2022-11-20 DIAGNOSIS — E8809 Other disorders of plasma-protein metabolism, not elsewhere classified: Secondary | ICD-10-CM | POA: Diagnosis present

## 2022-11-20 DIAGNOSIS — J9601 Acute respiratory failure with hypoxia: Principal | ICD-10-CM | POA: Diagnosis present

## 2022-11-20 DIAGNOSIS — Z87891 Personal history of nicotine dependence: Secondary | ICD-10-CM

## 2022-11-20 DIAGNOSIS — F1990 Other psychoactive substance use, unspecified, uncomplicated: Secondary | ICD-10-CM | POA: Diagnosis present

## 2022-11-20 DIAGNOSIS — F1120 Opioid dependence, uncomplicated: Secondary | ICD-10-CM | POA: Diagnosis present

## 2022-11-20 DIAGNOSIS — I10 Essential (primary) hypertension: Secondary | ICD-10-CM | POA: Diagnosis present

## 2022-11-20 DIAGNOSIS — T40601A Poisoning by unspecified narcotics, accidental (unintentional), initial encounter: Secondary | ICD-10-CM

## 2022-11-20 DIAGNOSIS — T402X4A Poisoning by other opioids, undetermined, initial encounter: Secondary | ICD-10-CM

## 2022-11-20 DIAGNOSIS — T43651A Poisoning by methamphetamines accidental (unintentional), initial encounter: Secondary | ICD-10-CM | POA: Diagnosis present

## 2022-11-20 DIAGNOSIS — T402X1A Poisoning by other opioids, accidental (unintentional), initial encounter: Principal | ICD-10-CM | POA: Diagnosis present

## 2022-11-20 DIAGNOSIS — Y92009 Unspecified place in unspecified non-institutional (private) residence as the place of occurrence of the external cause: Secondary | ICD-10-CM

## 2022-11-20 DIAGNOSIS — Z888 Allergy status to other drugs, medicaments and biological substances status: Secondary | ICD-10-CM

## 2022-11-20 DIAGNOSIS — Z79899 Other long term (current) drug therapy: Secondary | ICD-10-CM

## 2022-11-20 LAB — HIV ANTIBODY (ROUTINE TESTING W REFLEX): HIV Screen 4th Generation wRfx: NONREACTIVE

## 2022-11-20 LAB — URINALYSIS, ROUTINE W REFLEX MICROSCOPIC
Bilirubin Urine: NEGATIVE
Glucose, UA: NEGATIVE mg/dL
Hgb urine dipstick: NEGATIVE
Ketones, ur: NEGATIVE mg/dL
Leukocytes,Ua: NEGATIVE
Nitrite: NEGATIVE
Protein, ur: 30 mg/dL — AB
Specific Gravity, Urine: 1.016 (ref 1.005–1.030)
pH: 5 (ref 5.0–8.0)

## 2022-11-20 LAB — COMPREHENSIVE METABOLIC PANEL
ALT: 62 U/L — ABNORMAL HIGH (ref 0–44)
AST: 81 U/L — ABNORMAL HIGH (ref 15–41)
Albumin: 3.8 g/dL (ref 3.5–5.0)
Alkaline Phosphatase: 75 U/L (ref 38–126)
Anion gap: 8 (ref 5–15)
BUN: 19 mg/dL (ref 6–20)
CO2: 25 mmol/L (ref 22–32)
Calcium: 8.2 mg/dL — ABNORMAL LOW (ref 8.9–10.3)
Chloride: 104 mmol/L (ref 98–111)
Creatinine, Ser: 1.23 mg/dL (ref 0.61–1.24)
GFR, Estimated: >60 mL/min (ref >60)
Glucose, Bld: 99 mg/dL (ref 70–99)
Potassium: 4.3 mmol/L (ref 3.5–5.1)
Sodium: 137 mmol/L (ref 135–145)
Total Bilirubin: 0.7 mg/dL (ref 0.3–1.2)
Total Protein: 6.9 g/dL (ref 6.5–8.1)

## 2022-11-20 LAB — CBC WITH DIFFERENTIAL/PLATELET
Abs Immature Granulocytes: 0.03 10*3/uL (ref 0.00–0.07)
Basophils Absolute: 0 10*3/uL (ref 0.0–0.1)
Basophils Relative: 0 %
Eosinophils Absolute: 0 10*3/uL (ref 0.0–0.5)
Eosinophils Relative: 0 %
HCT: 42.7 % (ref 39.0–52.0)
Hemoglobin: 13.7 g/dL (ref 13.0–17.0)
Immature Granulocytes: 0 %
Lymphocytes Relative: 11 %
Lymphs Abs: 0.7 10*3/uL (ref 0.7–4.0)
MCH: 29.2 pg (ref 26.0–34.0)
MCHC: 32.1 g/dL (ref 30.0–36.0)
MCV: 91 fL (ref 80.0–100.0)
Monocytes Absolute: 0.4 10*3/uL (ref 0.1–1.0)
Monocytes Relative: 6 %
Neutro Abs: 5.7 10*3/uL (ref 1.7–7.7)
Neutrophils Relative %: 83 %
Platelets: 222 10*3/uL (ref 150–400)
RBC: 4.69 MIL/uL (ref 4.22–5.81)
RDW: 13.2 % (ref 11.5–15.5)
WBC: 6.9 10*3/uL (ref 4.0–10.5)
nRBC: 0 % (ref 0.0–0.2)

## 2022-11-20 LAB — RAPID URINE DRUG SCREEN, HOSP PERFORMED
Amphetamines: POSITIVE — AB
Barbiturates: NOT DETECTED
Benzodiazepines: NOT DETECTED
Cocaine: NOT DETECTED
Opiates: NOT DETECTED
Tetrahydrocannabinol: NOT DETECTED

## 2022-11-20 LAB — BLOOD GAS, VENOUS
Acid-base deficit: 0.6 mmol/L (ref 0.0–2.0)
Bicarbonate: 28.1 mmol/L — ABNORMAL HIGH (ref 20.0–28.0)
Drawn by: 51174
O2 Saturation: 76.3 %
Patient temperature: 35.4
pCO2, Ven: 60 mmHg (ref 44–60)
pH, Ven: 7.27 (ref 7.25–7.43)
pO2, Ven: 39 mmHg (ref 32–45)

## 2022-11-20 LAB — CBG MONITORING, ED: Glucose-Capillary: 208 mg/dL — ABNORMAL HIGH (ref 70–99)

## 2022-11-20 LAB — BRAIN NATRIURETIC PEPTIDE: B Natriuretic Peptide: 32 pg/mL (ref 0.0–100.0)

## 2022-11-20 LAB — HEMOGLOBIN A1C
Hgb A1c MFr Bld: 5.6 % (ref 4.8–5.6)
Mean Plasma Glucose: 114.02 mg/dL

## 2022-11-20 LAB — MRSA NEXT GEN BY PCR, NASAL: MRSA by PCR Next Gen: DETECTED — AB

## 2022-11-20 MED ORDER — SODIUM CHLORIDE 0.9 % IV SOLN: INTRAVENOUS | Status: DC

## 2022-11-20 MED ORDER — KETOROLAC TROMETHAMINE 15 MG/ML IJ SOLN
15.0000 mg | Freq: Three times a day (TID) | INTRAMUSCULAR | Status: DC | PRN
  Administered 2022-11-21 – 2022-11-22 (×2): 15 mg via INTRAVENOUS
  Filled 2022-11-20 (×3): qty 1

## 2022-11-20 MED ORDER — ONDANSETRON HCL 4 MG/2ML IJ SOLN: 4.0000 mg | Freq: Four times a day (QID) | INTRAMUSCULAR | Status: DC | PRN

## 2022-11-20 MED ORDER — CALCIUM GLUCONATE-NACL 1-0.675 GM/50ML-% IV SOLN
1.0000 g | Freq: Once | INTRAVENOUS | Status: AC
  Administered 2022-11-20: 1000 mg via INTRAVENOUS
  Filled 2022-11-20: qty 50

## 2022-11-20 MED ORDER — IPRATROPIUM-ALBUTEROL 0.5-2.5 (3) MG/3ML IN SOLN
3.0000 mL | Freq: Three times a day (TID) | RESPIRATORY_TRACT | Status: DC
  Administered 2022-11-20 – 2022-11-21 (×4): 3 mL via RESPIRATORY_TRACT
  Filled 2022-11-20 (×4): qty 3

## 2022-11-20 MED ORDER — NICOTINE 21 MG/24HR TD PT24
21.0000 mg | MEDICATED_PATCH | Freq: Every day | TRANSDERMAL | Status: DC | PRN
  Filled 2022-11-20: qty 1

## 2022-11-20 MED ORDER — PANTOPRAZOLE SODIUM 40 MG PO TBEC
40.0000 mg | DELAYED_RELEASE_TABLET | Freq: Every day | ORAL | Status: DC
  Administered 2022-11-20 – 2022-11-22 (×3): 40 mg via ORAL
  Filled 2022-11-20 (×3): qty 1

## 2022-11-20 MED ORDER — ENOXAPARIN SODIUM 40 MG/0.4ML IJ SOSY
40.0000 mg | PREFILLED_SYRINGE | INTRAMUSCULAR | Status: DC
  Administered 2022-11-20 – 2022-11-21 (×2): 40 mg via SUBCUTANEOUS
  Filled 2022-11-20 (×3): qty 0.4

## 2022-11-20 MED ORDER — PREDNISONE 20 MG PO TABS
40.0000 mg | ORAL_TABLET | Freq: Every day | ORAL | Status: DC
  Administered 2022-11-20 – 2022-11-22 (×3): 40 mg via ORAL
  Filled 2022-11-20 (×3): qty 2

## 2022-11-20 MED ORDER — NALOXONE HCL 0.4 MG/ML IJ SOLN
INTRAMUSCULAR | Status: AC
  Filled 2022-11-20: qty 1

## 2022-11-20 MED ORDER — GUAIFENESIN ER 600 MG PO TB12
1200.0000 mg | ORAL_TABLET | Freq: Two times a day (BID) | ORAL | Status: AC
  Administered 2022-11-20 – 2022-11-22 (×5): 1200 mg via ORAL
  Filled 2022-11-20 (×5): qty 2

## 2022-11-20 MED ORDER — DEXTROMETHORPHAN POLISTIREX ER 30 MG/5ML PO SUER: 30.0000 mg | Freq: Two times a day (BID) | ORAL | Status: DC | PRN

## 2022-11-20 MED ORDER — SODIUM CHLORIDE 0.9 % IV SOLN
3.0000 g | Freq: Once | INTRAVENOUS | Status: AC
  Administered 2022-11-20: 3 g via INTRAVENOUS
  Filled 2022-11-20: qty 3

## 2022-11-20 MED ORDER — ACETAMINOPHEN 650 MG RE SUPP: 650.0000 mg | Freq: Four times a day (QID) | RECTAL | Status: DC | PRN

## 2022-11-20 MED ORDER — ACETAMINOPHEN 325 MG PO TABS
650.0000 mg | ORAL_TABLET | Freq: Four times a day (QID) | ORAL | Status: DC | PRN
  Administered 2022-11-21: 650 mg via ORAL
  Filled 2022-11-20: qty 2

## 2022-11-20 MED ORDER — SODIUM CHLORIDE 0.9 % IV SOLN
3.0000 g | Freq: Four times a day (QID) | INTRAVENOUS | Status: DC
  Administered 2022-11-20 – 2022-11-22 (×8): 3 g via INTRAVENOUS
  Filled 2022-11-20 (×4): qty 8
  Filled 2022-11-20: qty 3
  Filled 2022-11-20 (×7): qty 8

## 2022-11-20 MED ORDER — CHLORHEXIDINE GLUCONATE CLOTH 2 % EX PADS
6.0000 | MEDICATED_PAD | Freq: Every day | CUTANEOUS | Status: DC
  Administered 2022-11-21 – 2022-11-22 (×2): 6 via TOPICAL

## 2022-11-20 MED ORDER — SODIUM CHLORIDE 0.9 % IV SOLN
100.0000 mg | Freq: Once | INTRAVENOUS | Status: AC
  Administered 2022-11-20: 100 mg via INTRAVENOUS
  Filled 2022-11-20 (×2): qty 100

## 2022-11-20 MED ORDER — NALOXONE HCL 0.4 MG/ML IJ SOLN
0.4000 mg | Freq: Once | INTRAMUSCULAR | Status: AC
  Administered 2022-11-20: 0.4 mg via INTRAVENOUS

## 2022-11-20 MED ORDER — ONDANSETRON HCL 4 MG PO TABS: 4.0000 mg | ORAL_TABLET | Freq: Four times a day (QID) | ORAL | Status: DC | PRN

## 2022-11-20 MED ORDER — MUPIROCIN 2 % EX OINT
1.0000 | TOPICAL_OINTMENT | Freq: Two times a day (BID) | CUTANEOUS | Status: DC
  Administered 2022-11-20 – 2022-11-22 (×4): 1 via NASAL
  Filled 2022-11-20: qty 22

## 2022-11-20 MED ORDER — BISACODYL 5 MG PO TBEC: 5.0000 mg | DELAYED_RELEASE_TABLET | Freq: Every day | ORAL | Status: DC | PRN

## 2022-11-20 NOTE — H&P (Signed)
History and Physical  [facility]  Alexander Morgan ZOX:[id number] DOB: 12-07-1980 DOA: 11/20/2022  PCP: Pcp, No  Patient coming from: Home  Level of care: Stepdown  I have personally briefly reviewed patient's old medical records in [facility] Link  Chief Complaint: Altered mental state   HPI: Alexander Morgan is a 42 year old gentleman with reported history of opioid addiction (reported by stepmother) presented to emergency department by EMS secondary to presumed opioid overdose.  He apparently was found apneic by family member who called EMS.  They feared that he had expired.  He was given 2 mg of naloxone by family members prior to EMS arrival but he remained unresponsive.  When EMS arrived they gave an additional 2 mg.  Patient was notably breathing but unresponsive and initially placed on a nonrebreather and brought to the emergency department.  Patient was only responsive to loud verbal stimuli when he was assessed in the ED.  He denied alcohol and recreational drug use.  He was given an additional treatment of naloxone in the emergency department with some improvement in his responsiveness.  His chest x-ray demonstrated findings of multifocal opacities consistent with aspiration pneumonia and he was started on antibiotics.  His labs otherwise were stable.  His urine drug screen is still pending.  He is being admitted for acute respiratory failure with hypoxia and opioid overdose.  Past Medical History:  Diagnosis Date   Back pain     Past Surgical History:  Procedure Laterality Date   LUMBAR SPINE SURGERY       reports that he has been smoking cigarettes. He has been smoking an average of 1 pack per day. He has never used smokeless tobacco. He reports that he does not drink alcohol and does not use drugs.  Allergies  Allergen Reactions   Mobic [Meloxicam] Other (See Comments)    REACTION: INTERACTION WITH BLOOD PRESSURE LEVELS.    Naproxen Other (See Comments)    REACTION:  INTERACTION WITH BLOOD PRESSURE LEVELS    No family history on file.  Prior to Admission medications   Not on File    Physical Exam: Vitals:   11/20/22 0430 11/20/22 0500 11/20/22 0630 11/20/22 0701  BP: (!) 94/57 105/65 113/71   Pulse: 82 94 96   Resp: 17 (!) 24 20   Temp:    98.8 F (37.1 C)  TempSrc:    Rectal  SpO2: 100%       Constitutional: somnolent but arousable, NAD, calm, comfortable Eyes: PERRL, lids and conjunctivae normal ENMT: Mucous membranes are moist. Posterior pharynx clear of any exudate or lesions.Normal dentition.  Neck: normal, supple, no masses, no thyromegaly Respiratory: rales RUL heard, rare expiratory wheezing, no crackles. Normal respiratory effort. No accessory muscle use.  Cardiovascular: normal s1, s2 sounds, no murmurs / rubs / gallops. No extremity edema. 2+ pedal pulses. No carotid bruits.  Abdomen: no tenderness, no masses palpated. No hepatosplenomegaly. Bowel sounds positive.  Musculoskeletal: no clubbing / cyanosis. No joint deformity upper and lower extremities. Good ROM, no contractures. Normal muscle tone.  Skin: no rashes, lesions, ulcers. No induration Neurologic: CN 2-12 grossly intact. Sensation intact, DTR normal. Strength 5/5 in all 4.  Psychiatric: Poor judgment and insight. Alert and oriented x 3. Flat affect.   Labs on Admission: I have personally reviewed following labs and imaging studies  CBC: Recent Labs  Lab 11/20/22 0510  WBC 6.9  NEUTROABS 5.7  HGB 13.7  HCT 42.7  MCV 91.0  PLT 222   Basic Metabolic Panel: Recent Labs  Lab 11/20/22 0510  NA 137  K 4.3  CL 104  CO2 25  GLUCOSE 99  BUN 19  CREATININE 1.23  CALCIUM 8.2*   GFR: CrCl cannot be calculated (Unknown ideal weight.). Liver Function Tests: Recent Labs  Lab 11/20/22 0510  AST 81*  ALT 62*  ALKPHOS 75  BILITOT 0.7  PROT 6.9  ALBUMIN 3.8   No results for input(s): "LIPASE", "AMYLASE" in the last 168 hours. No results for input(s):  "AMMONIA" in the last 168 hours. Coagulation Profile: No results for input(s): "INR", "PROTIME" in the last 168 hours. Cardiac Enzymes: No results for input(s): "CKTOTAL", "CKMB", "CKMBINDEX", "TROPONINI" in the last 168 hours. BNP (last 3 results) No results for input(s): "PROBNP" in the last 8760 hours. HbA1C: No results for input(s): "HGBA1C" in the last 72 hours. CBG: Recent Labs  Lab 11/20/22 0232  GLUCAP 208*   Lipid Profile: No results for input(s): "CHOL", "HDL", "LDLCALC", "TRIG", "CHOLHDL", "LDLDIRECT" in the last 72 hours. Thyroid Function Tests: No results for input(s): "TSH", "T4TOTAL", "FREET4", "T3FREE", "THYROIDAB" in the last 72 hours. Anemia Panel: No results for input(s): "VITAMINB12", "FOLATE", "FERRITIN", "TIBC", "IRON", "RETICCTPCT" in the last 72 hours. Urine analysis: No results found for: "COLORURINE", "APPEARANCEUR", "LABSPEC", "PHURINE", "GLUCOSEU", "HGBUR", "BILIRUBINUR", "KETONESUR", "PROTEINUR", "UROBILINOGEN", "NITRITE", "LEUKOCYTESUR"  Radiological Exams on Admission: DG Chest Portable 1 View  Result Date: 11/20/2022 CLINICAL DATA:  Hypoxia.  Overdose. EXAM: PORTABLE CHEST 1 VIEW COMPARISON:  10/04/2008 FINDINGS: Diffuse airspace disease in the right lung is associated with more modest patchy airspace disease in the left mid lung and left base. The cardiopericardial silhouette is within normal limits for size. Stomach is distended with gas. No acute bony abnormality. Telemetry leads overlie the chest. IMPRESSION: Diffuse airspace disease in the right lung with more modest patchy airspace disease in the left mid lung and left base. Features could reflect aspiration, asymmetric edema, or diffuse infection. Electronically Signed   By: Kennith Center M.D.   On: 11/20/2022 05:43    EKG: Independently reviewed. NSR   Assessment/Plan Principal Problem:   Acute respiratory failure with hypoxia (HCC) Active Problems:   Opioid overdose (HCC)   Aspiration  pneumonia (HCC)   Recreational drug use  Acute respiratory failure with hypoxia  -Secondary to opioid overdose and aspiration pneumonia -Treating supportively -Wean oxygen as able -IV antibiotics for pneumonia -Continue monitoring in stepdown ICU  Opioid overdose -Patient currently denying opioid consumption -still waiting on urine toxicology screen results -naloxone ordered PRN -aspiration precautions -supportive measures ordered  -TTS eval when medically stabilized before discharge home  Aspiration pneumonia  -continue IV antibiotics as ordered -follow blood culture -supportive measures as ordered -wean oxygen as able   Time spent: 62 mins  DVT prophylaxis: enoxparin   Code Status: Full   Family Communication:   Disposition Plan: TBD   Consults called:   Admission status: IP  Level of care: Stepdown Standley Dakins MD [facility] How to contact the [facility] Attending or Consulting provider 7A - 7P or covering provider during after hours 7P -7A, for this patient?  Check the care team in [facility] and look for a) attending/consulting TRH provider listed and b) the [facility] team listed Log into [email or web address] and use [place] universal password to access. If you do not have the password, please contact the hospital operator. Locate the [facility] provider you are looking for under [facility] and page to a number that you can  be directly reached. If you still have difficulty reaching the provider, please page the [facility] (Director on Call) for the Hospitalists listed on amion for assistance.   If 7PM-7AM, please contact night-coverage [email or web address] Password [facility]  11/20/2022, 9:46 AM

## 2022-11-20 NOTE — ED Triage Notes (Signed)
BIB EMS/ poss OD on heroin/ 2mg  IN narcan given by family, 2 mg IN given by EMS/ no vitals obtained, pt very restless/ no distress at this time.  Pt is arouses by voice

## 2022-11-20 NOTE — Progress Notes (Signed)
Took patient off 6L and placed on [place] HFNC.  Sats still not coming up and maintaining despite being on 15L.  Patient placed back on NRB at 15L.  Sat at 95%>

## 2022-11-20 NOTE — TOC CM/SW Note (Signed)
Transition of Care [facility]) - Inpatient Brief Assessment   Patient Details  Name: Alexander Morgan MRN: [id number] Date of Birth: 06/09/1981  Transition of Care [facility]) CM/SW Contact:    Leitha Bleak, RN Phone Number: 11/20/2022, 12:56 PM   Clinical Narrative:  Transition of Care Department [facility]) has reviewed patient and no TOC needs have been identified at this time. We will continue to monitor patient advancement through interdisciplinary progression rounds. If new patient transition needs arise, please place a TOC consult.  Transition of Care Asessment: Insurance and Status: Insurance coverage has been reviewed Patient has primary care physician: No (List added to AVS) Home environment has been reviewed: Family   Prior/Current Home Services: No current home services Social Determinants of Health Reivew: SDOH reviewed no interventions necessary Readmission risk has been reviewed: Yes Transition of care needs: transition of care needs identified, TOC will continue to follow (Substance abuse resources added.)

## 2022-11-20 NOTE — ED Notes (Signed)
Bair hugger removed. Pt given warm blankets.  

## 2022-11-20 NOTE — Hospital Course (Signed)
42 year old gentleman with reported history of opioid addiction (reported by stepmother) presented to emergency department by EMS secondary to presumed opioid overdose.  He apparently was found apneic by family member who called EMS.  They feared that he had expired.  He was given 2 mg of naloxone by family members prior to EMS arrival but he remained unresponsive.  When EMS arrived they gave an additional 2 mg.  Patient was notably breathing but unresponsive and initially placed on a nonrebreather and brought to the emergency department.  Patient was only responsive to loud verbal stimuli when he was assessed in the ED.  He denied alcohol and recreational drug use.  He was given an additional treatment of naloxone in the emergency department with some improvement in his responsiveness.  His chest x-ray demonstrated findings of multifocal opacities consistent with aspiration pneumonia and he was started on antibiotics.  His labs otherwise were stable.  His urine drug screen is still pending.  He is being admitted for acute respiratory failure with hypoxia and opioid overdose.

## 2022-11-20 NOTE — ED Notes (Signed)
Bair hugger applied.

## 2022-11-20 NOTE — ED Notes (Signed)
ED TO INPATIENT HANDOFF REPORT  ED Nurse Name and Phone #: Jess F  S Name/Age/Gender Alexander Morgan 42 y.o. male Room/Bed: APA02/APA02  Code Status   Code Status: Not on file  Home/SNF/Other Home Patient oriented to: self and place Is this baseline? No   Triage Complete: Triage complete  Chief Complaint Acute respiratory failure with hypoxia (HCC) [J96.01]  Triage Note BIB EMS/ poss OD on heroin/ 2mg  IN narcan given by family, 2 mg IN given by EMS/ no vitals obtained, pt very restless/ no distress at this time.  Pt is arouses by voice   Allergies Allergies  Allergen Reactions   Mobic [Meloxicam] Other (See Comments)    REACTION: INTERACTION WITH BLOOD PRESSURE LEVELS.    Naproxen Other (See Comments)    REACTION: INTERACTION WITH BLOOD PRESSURE LEVELS    Level of Care/Admitting Diagnosis ED Disposition     ED Disposition  Admit   Condition  --   Comment  Hospital Area: [facility] [[id number]]  Level of Care: Stepdown [14]  Covid Evaluation: Asymptomatic - no recent exposure (last 10 days) testing not required  Diagnosis: Acute respiratory failure with hypoxia [facility]) [[id number]]  Admitting Physician: Frankey Shown [[id number]]  Attending Physician: Frankey Shown [[id number]]  Certification:: I certify this patient will need inpatient services for at least 2 midnights  Estimated Length of Stay: 3          B Medical/Surgery History Past Medical History:  Diagnosis Date   Back pain    Past Surgical History:  Procedure Laterality Date   LUMBAR SPINE SURGERY       A IV Location/Drains/Wounds Patient Lines/Drains/Airways Status     Active Line/Drains/Airways     Name Placement date Placement time Site Days   Peripheral IV 11/20/22 20 G Right Antecubital 11/20/22  0236  Antecubital  less than 1            Intake/Output Last 24 hours No intake or output data in the 24 hours ending 11/20/22 2956  Labs/Imaging Results for orders placed or  performed during the hospital encounter of 11/20/22 (from the past 48 hour(s))  CBG monitoring, ED     Status: Abnormal   Collection Time: 11/20/22  2:32 AM  Result Value Ref Range   Glucose-Capillary 208 (H) 70 - 99 mg/dL    Comment: Glucose reference range applies only to samples taken after fasting for at least 8 hours.  CBC with Differential     Status: None   Collection Time: 11/20/22  5:10 AM  Result Value Ref Range   WBC 6.9 4.0 - 10.5 K/uL   RBC 4.69 4.22 - 5.81 MIL/uL   Hemoglobin 13.7 13.0 - 17.0 g/dL   HCT 21.3 08.6 - 57.8 %   MCV 91.0 80.0 - 100.0 fL   MCH 29.2 26.0 - 34.0 pg   MCHC 32.1 30.0 - 36.0 g/dL   RDW 46.9 62.9 - 52.8 %   Platelets 222 150 - 400 K/uL   nRBC 0.0 0.0 - 0.2 %   Neutrophils Relative % 83 %   Neutro Abs 5.7 1.7 - 7.7 K/uL   Lymphocytes Relative 11 %   Lymphs Abs 0.7 0.7 - 4.0 K/uL   Monocytes Relative 6 %   Monocytes Absolute 0.4 0.1 - 1.0 K/uL   Eosinophils Relative 0 %   Eosinophils Absolute 0.0 0.0 - 0.5 K/uL   Basophils Relative 0 %   Basophils Absolute 0.0 0.0 - 0.1 K/uL   Immature Granulocytes  0 %   Abs Immature Granulocytes 0.03 0.00 - 0.07 K/uL    Comment: Performed at [facility], [address], [place], [place] [address]  Comprehensive metabolic panel     Status: Abnormal   Collection Time: 11/20/22  5:10 AM  Result Value Ref Range   Sodium 137 135 - 145 mmol/L   Potassium 4.3 3.5 - 5.1 mmol/L   Chloride 104 98 - 111 mmol/L   CO2 25 22 - 32 mmol/L   Glucose, Bld 99 70 - 99 mg/dL    Comment: Glucose reference range applies only to samples taken after fasting for at least 8 hours.   BUN 19 6 - 20 mg/dL   Creatinine, Ser 7.82 0.61 - 1.24 mg/dL   Calcium 8.2 (L) 8.9 - 10.3 mg/dL   Total Protein 6.9 6.5 - 8.1 g/dL   Albumin 3.8 3.5 - 5.0 g/dL   AST 81 (H) 15 - 41 U/L   ALT 62 (H) 0 - 44 U/L   Alkaline Phosphatase 75 38 - 126 U/L   Total Bilirubin 0.7 0.3 - 1.2 mg/dL   GFR, Estimated >95 >62 mL/min    Comment: (NOTE) Calculated  using the CKD-EPI Creatinine Equation (2021)    Anion gap 8 5 - 15    Comment: Performed at [facility], [address] [place], [place] [address]  Blood gas, venous (at [facility] and [facility])     Status: Abnormal   Collection Time: 11/20/22  5:10 AM  Result Value Ref Range   pH, Ven 7.27 7.25 - 7.43   pCO2, Ven 60 44 - 60 mmHg   pO2, Ven 39 32 - 45 mmHg   Bicarbonate 28.1 (H) 20.0 - 28.0 mmol/L   Acid-base deficit 0.6 0.0 - 2.0 mmol/L   O2 Saturation 76.3 %   Patient temperature 35.4    Collection site LEFT ANTECUBITAL    Drawn by [id number]     Comment: Performed at [facility], [address] [place], [place] [address]  Brain natriuretic peptide     Status: None   Collection Time: 11/20/22  5:10 AM  Result Value Ref Range   B Natriuretic Peptide 32.0 0.0 - 100.0 pg/mL    Comment: Performed at [facility], [address], [place], [place] [address]   DG Chest Portable 1 View  Result Date: 11/20/2022 CLINICAL DATA:  Hypoxia.  Overdose. EXAM: PORTABLE CHEST 1 VIEW COMPARISON:  10/04/2008 FINDINGS: Diffuse airspace disease in the right lung is associated with more modest patchy airspace disease in the left mid lung and left base. The cardiopericardial silhouette is within normal limits for size. Stomach is distended with gas. No acute bony abnormality. Telemetry leads overlie the chest. IMPRESSION: Diffuse airspace disease in the right lung with more modest patchy airspace disease in the left mid lung and left base. Features could reflect aspiration, asymmetric edema, or diffuse infection. Electronically Signed   By: Kennith Center M.D.   On: 11/20/2022 05:43    Pending Labs Unresulted Labs (From admission, onward)     Start     Ordered   11/20/22 0551  Rapid urine drug screen (hospital performed)  ONCE - STAT,   STAT        11/20/22 0550   11/20/22 0550  Urinalysis, Routine w reflex microscopic -Urine, Clean Catch  Once,   URGENT       Question:  Specimen Source  Answer:  Urine, Clean Catch    11/20/22 0550   11/20/22 2440  Culture, blood (single)  Once,   STAT        11/20/22 0547            Vitals/Pain Today's Vitals   11/20/22 0330 11/20/22 0400 11/20/22 0430 11/20/22 0500  BP: 97/70 96/69 (!) 94/57 105/65  Pulse: 92 86 82 94  Resp: (!) 24 (!) 22 17 (!) 24  Temp:      TempSrc:      SpO2: 99% 100% 100%     Isolation Precautions No active isolations  Medications Medications  doxycycline (VIBRAMYCIN) 100 mg in sodium chloride 0.9 % 250 mL IVPB (100 mg Intravenous New Bag/Given 11/20/22 0624)  Ampicillin-Sulbactam (UNASYN) 3 g in sodium chloride 0.9 % 100 mL IVPB (has no administration in time range)  naloxone [facility]) injection 0.4 mg (0.4 mg Intravenous Given 11/20/22 0238)    Mobility walks     Focused Assessments Pulmonary Assessment Handoff:  Lung sounds:  now on 4L  O2 Device: NRB (EtCO2 in nares as well) O2 Flow Rate (L/min): 15 L/min    R Recommendations: See Admitting Provider Note  Report given to:

## 2022-11-20 NOTE — ED Notes (Signed)
Pt placed back on NRB, stats keep dropping, pt does not like high flow [place] and continues to take it off

## 2022-11-20 NOTE — Plan of Care (Signed)
Min assist with ADLs. Breathing regular. No SOB. Apneic pattern while asleep without desat.

## 2022-11-20 NOTE — Discharge Instructions (Addendum)
Providers Accepting New Patients in [place], [place]    [facility] [address]  [place], [place] [address] [phone] Accepts most insurances  [facility] [address] [place], [place] [address] [phone] Accepts most insurances  [facility] [address] [place], [place] [address]  [phone] Must meet requirements  [facility] [address] [place], [place] [address] [phone] Accepts most insurances  [facility] [address]  [place], [place] [address] [phone] Accepts most insurances  [facility] [address]   [place], [place]   [phone] Accepts most insurances  [facility])  [phone] [address]  [place], [place] [address] [phone] Accepts most insurances      [facility] [address]  [place], [place] [address] [phone] Accepts most insurances  [facility] [address] [place], [place] [address] [phone] option 1 Accepts Medicaid and [facility] [address]  [place], [place] [address] [phone] Accepts most insurances  [place]on Gully, MD [address] [place], [place] [address] [phone] Accepts most insurances  [facility] [address]  [place], [place] [address] [phone] Accepts most insurances  [facility] [phone] [address] [place], [place] [address] [phone] Accepts most insurances  [place], [place] [address], [address] [place], [place] [address] [phone]  Accepts most insurances        IMPORTANT INFORMATION: PAY CLOSE ATTENTION   PHYSICIAN DISCHARGE INSTRUCTIONS  Follow with Primary care provider  Pcp, No  and other consultants as instructed by your Hospitalist Physician  SEEK MEDICAL CARE OR RETURN TO EMERGENCY ROOM IF SYMPTOMS  COME BACK, WORSEN OR NEW PROBLEM DEVELOPS   Please note: You were cared for by a hospitalist during your hospital stay. Every effort will be made to forward records to your primary care provider.  You can request that your primary care provider send for your hospital records if they have not received them.  Once you are discharged, your primary care physician will handle any further medical issues. Please note that NO REFILLS for any discharge medications will be authorized once you are discharged, as it is imperative that you return to your primary care physician (or establish a relationship with a primary care physician if you do not have one) for your post hospital discharge needs so that they can reassess your need for medications and monitor your lab values.  Please get a complete blood count and chemistry panel checked by your Primary MD at your next visit, and again as instructed by your Primary MD.  Get Medicines reviewed and adjusted: Please take all your medications with you for your next visit with your Primary MD  Laboratory/radiological data: Please request your Primary MD to go over all hospital tests and procedure/radiological results at the follow up, please ask your primary care provider to get all Hospital records sent to his/her office.  In some cases, they will be blood work, cultures and biopsy results pending at the time of your discharge. Please request that your primary care provider follow up on these results.  If you are diabetic, please bring your blood sugar readings with you to your follow up appointment with primary care.    Please call and make your follow up appointments as soon as possible.    Also Note the following: If you experience worsening  of your admission symptoms, develop shortness of breath, life threatening emergency, suicidal or homicidal thoughts you must seek medical attention immediately by calling 911 or calling your MD immediately  if symptoms  less severe.  You must read complete instructions/literature along with all the possible adverse reactions/side effects for all the Medicines you take and that have been prescribed to you. Take any new Medicines after you have completely understood and accpet all the possible adverse reactions/side effects.   Do not drive when taking Pain medications or sleeping medications (Benzodiazepines)  Do not take more than prescribed Pain, Sleep and Anxiety Medications. It is not advisable to combine anxiety,sleep and pain medications without talking with your primary care practitioner  Special Instructions: If you have smoked or chewed Tobacco  in the last 2 yrs please stop smoking, stop any regular Alcohol  and or any Recreational drug use.  Wear Seat belts while driving.  Do not drive if taking any narcotic, mind altering or controlled substances or recreational drugs or alcohol.

## 2022-11-20 NOTE — ED Provider Notes (Signed)
[facility] AT [facility] Provider Note   CSN: [id number] Arrival date & time: 11/20/22  7829     History  Chief Complaint  Patient presents with   Drug Overdose    Alexander Morgan is a 42 y.o. male.  42 year old male who presents via EMS secondary to suspected opiate overdose.  Patient was found apneic by his family EMS was called and the patient's family given 2 mg of IM Narcan.  On fire's arrival the patient was still unresponsive and apneic another 2 mg were given.  On EMS arrival patient was breathing but still unresponsive stable on nonrebreather brought here for further evaluation.  Patient awakes to loud verbal stimuli however denies any alcohol, or drug use.  Much later the patient's stepmother states that he has a problem with opiate addiction similar to her deceased husband.  States that he was unresponsive and she thought he was dead when they loaded him up in the EMS.  She also states that she wants to be the 1 that is called with any issues the patient agrees to that.  She states that he will likely be discharged to live with her and she will try to get him the help that he needs for addiction.   Drug Overdose       Home Medications Prior to Admission medications   Medication Sig Start Date End Date Taking? Authorizing Provider  methocarbamol (ROBAXIN) 500 MG tablet Take 1 tablet (500 mg total) by mouth every 6 (six) hours as needed for muscle spasms. 01/05/15   Elson Areas, PA-C      Allergies    Mobic [meloxicam] and Naproxen    Review of Systems   Review of Systems  Physical Exam Updated Vital Signs BP 113/71   Pulse 96   Temp (!) 95.7 F (35.4 C) (Rectal)   Resp 20   SpO2 100%  Physical Exam Vitals and nursing note reviewed.  Constitutional:      Appearance: He is well-developed.  HENT:     Head: Normocephalic and atraumatic.  Eyes:     Pupils: Pupils are equal, round, and reactive to light.     Comments: Mildly  constricted bilaterally  Cardiovascular:     Rate and Rhythm: Tachycardia present.  Pulmonary:     Effort: Pulmonary effort is normal. No respiratory distress.     Breath sounds: Wheezing and rales present.  Abdominal:     General: There is no distension.  Musculoskeletal:        General: Normal range of motion.     Cervical back: Normal range of motion.  Skin:    General: Skin is warm and dry.  Neurological:     Mental Status: He is disoriented.     ED Results / Procedures / Treatments   Labs (all labs ordered are listed, but only abnormal results are displayed) Labs Reviewed  COMPREHENSIVE METABOLIC PANEL - Abnormal; Notable for the following components:      Result Value   Calcium 8.2 (*)    AST 81 (*)    ALT 62 (*)    All other components within normal limits  BLOOD GAS, VENOUS - Abnormal; Notable for the following components:   Bicarbonate 28.1 (*)    All other components within normal limits  CBG MONITORING, ED - Abnormal; Notable for the following components:   Glucose-Capillary 208 (*)    All other components within normal limits  CULTURE, BLOOD (SINGLE)  CBC WITH  DIFFERENTIAL/PLATELET  BRAIN NATRIURETIC PEPTIDE  URINALYSIS, ROUTINE W REFLEX MICROSCOPIC  RAPID URINE DRUG SCREEN, HOSP PERFORMED    EKG EKG Interpretation  Date/Time:  Wednesday Nov 20 2022 02:36:51 EDT Ventricular Rate:  85 PR Interval:  169 QRS Duration: 88 QT Interval:  381 QTC Calculation: 453 R Axis:   74 Text Interpretation: Sinus rhythm Confirmed by Marily Memos [phone]) on 11/20/2022 2:48:33 AM  Radiology DG Chest Portable 1 View  Result Date: 11/20/2022 CLINICAL DATA:  Hypoxia.  Overdose. EXAM: PORTABLE CHEST 1 VIEW COMPARISON:  10/04/2008 FINDINGS: Diffuse airspace disease in the right lung is associated with more modest patchy airspace disease in the left mid lung and left base. The cardiopericardial silhouette is within normal limits for size. Stomach is distended with gas. No  acute bony abnormality. Telemetry leads overlie the chest. IMPRESSION: Diffuse airspace disease in the right lung with more modest patchy airspace disease in the left mid lung and left base. Features could reflect aspiration, asymmetric edema, or diffuse infection. Electronically Signed   By: Kennith Center M.D.   On: 11/20/2022 05:43    Procedures .Critical Care  Performed by: Marily Memos, MD Authorized by: Marily Memos, MD   Critical care provider statement:    Critical care time (minutes):  30   Critical care was necessary to treat or prevent imminent or life-threatening deterioration of the following conditions:  Respiratory failure   Critical care was time spent personally by me on the following activities:  Development of treatment plan with patient or surrogate, discussions with consultants, evaluation of patient's response to treatment, examination of patient, ordering and review of laboratory studies, ordering and review of radiographic studies, ordering and performing treatments and interventions, pulse oximetry, re-evaluation of patient's condition and review of old charts     Medications Ordered in ED Medications  doxycycline (VIBRAMYCIN) 100 mg in sodium chloride 0.9 % 250 mL IVPB (100 mg Intravenous New Bag/Given 11/20/22 0624)  Ampicillin-Sulbactam (UNASYN) 3 g in sodium chloride 0.9 % 100 mL IVPB (has no administration in time range)  naloxone [facility]) injection 0.4 mg (0.4 mg Intravenous Given 11/20/22 0238)    ED Course/ Medical Decision Making/ A&P                             Medical Decision Making Amount and/or Complexity of Data Reviewed Labs: ordered. Radiology: ordered.  Risk Prescription drug management. Decision regarding hospitalization.   Initial plan was to check a blood sugar, EKG and observe for improvement.  Patient's mental status improved however his hypoxia did not.  Was given another dose of Narcan here.  Was going back and forth via nasal  cannula nonrebreather secondary to patient not really being comfortable with nasal cannula however once his mental status improved more he did tolerate the nasal cannula.  At this time a chest x-ray blood gas and basic labs were checked.  On my interpretation he has a significant right multifocal opacities.  As he did overdose concern for aspiration versus pulmonary edema secondary to the overdose or Narcan.  Antibiotics started.  Discussed with hospitalist for admission.  Final Clinical Impression(s) / ED Diagnoses Final diagnoses:  Acute respiratory failure with hypoxia (HCC)  Opiate overdose, accidental or unintentional, initial encounter [facility])    Rx / DC Orders ED Discharge Orders     None         Miloh Alcocer, Barbara Cower, MD 11/20/22 [phone]

## 2022-11-20 NOTE — ED Notes (Signed)
Pt taken off non rebreather and placed on 6L

## 2022-11-20 NOTE — ED Notes (Signed)
Pt oxygen 72% on RA, placed on NRB

## 2022-11-21 ENCOUNTER — Inpatient Hospital Stay (HOSPITAL_COMMUNITY): Payer: Self-pay

## 2022-11-21 LAB — COMPREHENSIVE METABOLIC PANEL
ALT: 37 U/L (ref 0–44)
AST: 25 U/L (ref 15–41)
Albumin: 2.8 g/dL — ABNORMAL LOW (ref 3.5–5.0)
Alkaline Phosphatase: 50 U/L (ref 38–126)
Anion gap: 7 (ref 5–15)
BUN: 13 mg/dL (ref 6–20)
CO2: 24 mmol/L (ref 22–32)
Calcium: 8.1 mg/dL — ABNORMAL LOW (ref 8.9–10.3)
Chloride: 107 mmol/L (ref 98–111)
Creatinine, Ser: 0.85 mg/dL (ref 0.61–1.24)
GFR, Estimated: >60 mL/min (ref >60)
Glucose, Bld: 141 mg/dL — ABNORMAL HIGH (ref 70–99)
Potassium: 4 mmol/L (ref 3.5–5.1)
Sodium: 138 mmol/L (ref 135–145)
Total Bilirubin: 1.1 mg/dL (ref 0.3–1.2)
Total Protein: 6.1 g/dL — ABNORMAL LOW (ref 6.5–8.1)

## 2022-11-21 LAB — CBC WITH DIFFERENTIAL/PLATELET
Abs Immature Granulocytes: 0.03 10*3/uL (ref 0.00–0.07)
Basophils Absolute: 0 10*3/uL (ref 0.0–0.1)
Basophils Relative: 0 %
Eosinophils Absolute: 0 10*3/uL (ref 0.0–0.5)
Eosinophils Relative: 0 %
HCT: 34.1 % — ABNORMAL LOW (ref 39.0–52.0)
Hemoglobin: 11 g/dL — ABNORMAL LOW (ref 13.0–17.0)
Immature Granulocytes: 0 %
Lymphocytes Relative: 19 %
Lymphs Abs: 2.5 10*3/uL (ref 0.7–4.0)
MCH: 29.7 pg (ref 26.0–34.0)
MCHC: 32.3 g/dL (ref 30.0–36.0)
MCV: 92.2 fL (ref 80.0–100.0)
Monocytes Absolute: 0.7 10*3/uL (ref 0.1–1.0)
Monocytes Relative: 5 %
Neutro Abs: 10 10*3/uL — ABNORMAL HIGH (ref 1.7–7.7)
Neutrophils Relative %: 76 %
Platelets: 164 10*3/uL (ref 150–400)
RBC: 3.7 MIL/uL — ABNORMAL LOW (ref 4.22–5.81)
RDW: 13.2 % (ref 11.5–15.5)
WBC: 13.3 10*3/uL — ABNORMAL HIGH (ref 4.0–10.5)
nRBC: 0 % (ref 0.0–0.2)

## 2022-11-21 LAB — MAGNESIUM: Magnesium: 2.1 mg/dL (ref 1.7–2.4)

## 2022-11-21 MED ORDER — METOPROLOL TARTRATE 5 MG/5ML IV SOLN
2.5000 mg | Freq: Four times a day (QID) | INTRAVENOUS | Status: DC
  Administered 2022-11-21 – 2022-11-22 (×3): 2.5 mg via INTRAVENOUS
  Filled 2022-11-21 (×3): qty 5

## 2022-11-21 MED ORDER — IPRATROPIUM-ALBUTEROL 0.5-2.5 (3) MG/3ML IN SOLN
3.0000 mL | Freq: Two times a day (BID) | RESPIRATORY_TRACT | Status: DC
  Administered 2022-11-21 – 2022-11-22 (×2): 3 mL via RESPIRATORY_TRACT
  Filled 2022-11-21 (×2): qty 3

## 2022-11-21 MED ORDER — LORAZEPAM 2 MG/ML IJ SOLN
1.0000 mg | Freq: Once | INTRAMUSCULAR | Status: AC
  Administered 2022-11-21: 1 mg via INTRAVENOUS
  Filled 2022-11-21: qty 1

## 2022-11-21 MED ORDER — CALCIUM GLUCONATE-NACL 1-0.675 GM/50ML-% IV SOLN
1.0000 g | Freq: Once | INTRAVENOUS | Status: AC
  Administered 2022-11-21: 1000 mg via INTRAVENOUS
  Filled 2022-11-21: qty 50

## 2022-11-21 NOTE — Progress Notes (Signed)
PROGRESS NOTE   Alexander Morgan  WUJ:[id number] DOB: June 12, 1981 DOA: 11/20/2022 PCP: Pcp, No   Chief Complaint  Patient presents with   Drug Overdose   Level of care: Stepdown  Brief Admission History:  42 year old gentleman with reported history of opioid addiction (reported by stepmother) presented to emergency department by EMS secondary to presumed opioid overdose.  He apparently was found apneic by family member who called EMS.  They feared that he had expired.  He was given 2 mg of naloxone by family members prior to EMS arrival but he remained unresponsive.  When EMS arrived they gave an additional 2 mg.  Patient was notably breathing but unresponsive and initially placed on a nonrebreather and brought to the emergency department.  Patient was only responsive to loud verbal stimuli when he was assessed in the ED.  He denied alcohol and recreational drug use.  He was given an additional treatment of naloxone in the emergency department with some improvement in his responsiveness.  His chest x-ray demonstrated findings of multifocal opacities consistent with aspiration pneumonia and he was started on antibiotics.  His labs otherwise were stable.  His urine drug screen is still pending.  He is being admitted for acute respiratory failure with hypoxia and opioid overdose.   Assessment and Plan: Acute respiratory failure with hypoxia  -Secondary to opioid overdose and aspiration pneumonia -Treated supportively -Weaned oxygen to room air  -IV antibiotics for pneumonia -Continue monitoring in stepdown ICU   Question of Opioid overdose vs amphetamine overdose - UDS neg for opioid but he responded to multiple doses of naloxone - UDS positive for amphetamines - Pt denies ever using recreational drugs -aspiration precautions -supportive measures ordered  -TTS eval when medically stabilized before discharge home   Aspiration pneumonia  -continue IV antibiotics as ordered -follow blood  culture -supportive measures as ordered -wean oxygen as able  -repeat CXR in AM   Elevated BPs/HTN - reduce IV fluids - adding lopressor 2.5 mg IV q6h with holding parameters   Hypocalcemia / Hypoalbuminemia  - possibly from poor oral intake as his albumin is also low - IV calcium gluconate ordered - recheck CMP in AM    DVT prophylaxis: enoxaparin Code Status: Full  Family Communication:  Disposition: Status is: Inpatient Remains inpatient appropriate because: IV fluids, intensity   Consultants:   Procedures:   Antimicrobials:  Ampicillin sulbactam 5/22>>   Subjective: He denies ever taking anything but does not remember what happened.  Denies taking drugs.   Objective: Vitals:   11/21/22 1145 11/21/22 1200 11/21/22 1300 11/21/22 1400  BP:  (!) 163/79 (!) 149/89 (!) 158/103  Pulse:  76 81 82  Resp:  (!) 22 20 (!) 21  Temp: 98.3 F (36.8 C)     TempSrc: Oral     SpO2:  98% 96% 96%    Intake/Output Summary (Last 24 hours) at 11/21/2022 1424 Last data filed at 11/21/2022 1255 Gross per 24 hour  Intake 3546.28 ml  Output 3025 ml  Net 521.28 ml   There were no vitals filed for this visit. Examination:  General exam: Appears somnolent but arousable, mostly sleeping all the time  Respiratory system: Clear to auscultation. Respiratory effort normal. Cardiovascular system: normal S1 & S2 heard. No JVD, murmurs, rubs, gallops or clicks. No pedal edema. Gastrointestinal system: Abdomen is nondistended, soft and nontender. No organomegaly or masses felt. Normal bowel sounds heard. Central nervous system: Alert and oriented. No focal neurological deficits. Extremities: Symmetric 5 x  5 power. Skin: No rashes, lesions or ulcers. Psychiatry: Judgement and insight appear normal. Mood & affect appropriate.   Data Reviewed: I have personally reviewed following labs and imaging studies  CBC: Recent Labs  Lab 11/20/22 0510 11/21/22 0433  WBC 6.9 13.3*  NEUTROABS 5.7  10.0*  HGB 13.7 11.0*  HCT 42.7 34.1*  MCV 91.0 92.2  PLT 222 164    Basic Metabolic Panel: Recent Labs  Lab 11/20/22 0510 11/21/22 0433  NA 137 138  K 4.3 4.0  CL 104 107  CO2 25 24  GLUCOSE 99 141*  BUN 19 13  CREATININE 1.23 0.85  CALCIUM 8.2* 8.1*  MG  --  2.1    CBG: Recent Labs  Lab 11/20/22 0232  GLUCAP 208*    Recent Results (from the past 240 hour(s))  Culture, blood (single)     Status: None (Preliminary result)   Collection Time: 11/20/22  6:10 AM   Specimen: Right Antecubital; Blood  Result Value Ref Range Status   Specimen Description RIGHT ANTECUBITAL  Final   Special Requests   Final    BOTTLES DRAWN AEROBIC AND ANAEROBIC Blood Culture results may not be optimal due to an excessive volume of blood received in culture bottles   Culture   Final    NO GROWTH 1 DAY Performed at [facility], [address] [place], [place] [address]    Report Status PENDING  Incomplete  MRSA Next Gen by PCR, Nasal     Status: Abnormal   Collection Time: 11/20/22  7:48 AM   Specimen: Nasal Mucosa; Nasal Swab  Result Value Ref Range Status   MRSA by PCR Next Gen DETECTED (A) NOT DETECTED Final    Comment: RESULT CALLED TO, READ BACK BY AND VERIFIED WITH: DANNY GREBER @ 1648 ON 11/20/22 C VARNER (NOTE) The GeneXpert MRSA Assay (FDA approved for NASAL specimens only), is one component of a comprehensive MRSA colonization surveillance program. It is not intended to diagnose MRSA infection nor to guide or monitor treatment for MRSA infections. Test performance is not FDA approved in patients less than 45 years old. Performed at [facility], [address], [place], [place] [address]      Radiology Studies: DG Chest Portable 1 View  Result Date: 11/20/2022 CLINICAL DATA:  Hypoxia.  Overdose. EXAM: PORTABLE CHEST 1 VIEW COMPARISON:  10/04/2008 FINDINGS: Diffuse airspace disease in the right lung is associated with more modest patchy airspace disease in the left mid  lung and left base. The cardiopericardial silhouette is within normal limits for size. Stomach is distended with gas. No acute bony abnormality. Telemetry leads overlie the chest. IMPRESSION: Diffuse airspace disease in the right lung with more modest patchy airspace disease in the left mid lung and left base. Features could reflect aspiration, asymmetric edema, or diffuse infection. Electronically Signed   By: Kennith Center M.D.   On: 11/20/2022 05:43    Scheduled Meds:  Chlorhexidine Gluconate Cloth  6 each Topical Q0600   enoxaparin (LOVENOX) injection  40 mg Subcutaneous Q24H   guaiFENesin  1,200 mg Oral BID   ipratropium-albuterol  3 mL Nebulization BID   mupirocin ointment  1 Application Nasal BID   pantoprazole  40 mg Oral Q0600   predniSONE  40 mg Oral Q breakfast   Continuous Infusions:  sodium chloride 140 mL/hr at 11/21/22 1255   ampicillin-sulbactam (UNASYN) IV Stopped (11/21/22 1146)     LOS: 1 day   Time spent: 53 mins  Aliciana Ricciardi,  MD How to contact the [facility] Attending or Consulting provider 7A - 7P or covering provider during after hours 7P -7A, for this patient?  Check the care team in [facility] and look for a) attending/consulting [facility] provider listed and b) the [place] team listed Log into [email or web address] and use [facility] universal password to access. If you do not have the password, please contact the hospital operator. Locate the [facility] provider you are looking for under [facility] and page to a number that you can be directly reached. If you still have difficulty reaching the provider, please page the [facility] (Director on Call) for the Hospitalists listed on amion for assistance.  11/21/2022, 2:24 PM

## 2022-11-21 NOTE — Progress Notes (Signed)
Pt remains very somnolent, pt does awaken to name, follows commands but very easily drifts off to sleep. Pt does have periods of apnea, O2 did drop to 87%, pt was place on 2L [place] w/o any other desaturation issues.

## 2022-11-21 NOTE — Progress Notes (Signed)
Pt monitor showed "SVT" with rates in the lower 200's. Pt with no complaints besides the aching mid-sternal pain that he was admitted with. EKG performed and showed NSR. All leads replaced, will continue to monitor.

## 2022-11-22 LAB — COMPREHENSIVE METABOLIC PANEL
ALT: 27 U/L (ref 0–44)
AST: 16 U/L (ref 15–41)
Albumin: 2.8 g/dL — ABNORMAL LOW (ref 3.5–5.0)
Alkaline Phosphatase: 45 U/L (ref 38–126)
Anion gap: 5 (ref 5–15)
BUN: 14 mg/dL (ref 6–20)
CO2: 24 mmol/L (ref 22–32)
Calcium: 8.2 mg/dL — ABNORMAL LOW (ref 8.9–10.3)
Chloride: 108 mmol/L (ref 98–111)
Creatinine, Ser: 0.94 mg/dL (ref 0.61–1.24)
GFR, Estimated: >60 mL/min (ref >60)
Glucose, Bld: 94 mg/dL (ref 70–99)
Potassium: 3.8 mmol/L (ref 3.5–5.1)
Sodium: 137 mmol/L (ref 135–145)
Total Bilirubin: 0.8 mg/dL (ref 0.3–1.2)
Total Protein: 5.8 g/dL — ABNORMAL LOW (ref 6.5–8.1)

## 2022-11-22 MED ORDER — METOPROLOL TARTRATE 25 MG PO TABS: 25.0000 mg | ORAL_TABLET | Freq: Two times a day (BID) | ORAL | 1 refills | Status: DC

## 2022-11-22 MED ORDER — AMOXICILLIN-POT CLAVULANATE 875-125 MG PO TABS: 1.0000 | ORAL_TABLET | Freq: Two times a day (BID) | ORAL | 0 refills | Status: DC

## 2022-11-22 MED ORDER — AMLODIPINE BESYLATE 5 MG PO TABS
5.0000 mg | ORAL_TABLET | Freq: Every day | ORAL | Status: DC
  Administered 2022-11-22: 5 mg via ORAL
  Filled 2022-11-22: qty 1

## 2022-11-22 MED ORDER — AMOXICILLIN-POT CLAVULANATE 875-125 MG PO TABS: 1.0000 | ORAL_TABLET | Freq: Two times a day (BID) | ORAL | 0 refills | Status: AC

## 2022-11-22 MED ORDER — AMLODIPINE BESYLATE 5 MG PO TABS: 5.0000 mg | ORAL_TABLET | Freq: Every day | ORAL | 1 refills | Status: DC

## 2022-11-22 MED ORDER — HYDRALAZINE HCL 20 MG/ML IJ SOLN
10.0000 mg | INTRAMUSCULAR | Status: DC | PRN
  Administered 2022-11-22: 10 mg via INTRAVENOUS
  Filled 2022-11-22: qty 1

## 2022-11-22 MED ORDER — METOPROLOL TARTRATE 25 MG PO TABS: 25.0000 mg | ORAL_TABLET | Freq: Two times a day (BID) | ORAL | 1 refills | Status: AC

## 2022-11-22 MED ORDER — METOPROLOL TARTRATE 25 MG PO TABS
25.0000 mg | ORAL_TABLET | Freq: Two times a day (BID) | ORAL | Status: DC
  Administered 2022-11-22: 25 mg via ORAL
  Filled 2022-11-22: qty 1

## 2022-11-22 MED ORDER — AMLODIPINE BESYLATE 5 MG PO TABS: 5.0000 mg | ORAL_TABLET | Freq: Every day | ORAL | 1 refills | Status: AC

## 2022-11-22 NOTE — Discharge Summary (Signed)
Physician Discharge Summary  Alexander Morgan WUJ:[id number] DOB: 1981/06/02 DOA: 11/20/2022   Admit date: 11/20/2022 Discharge date: 11/22/2022  Admitted From:  Home  Disposition: Home   Recommendations for Outpatient Follow-up:  Follow up with PCP in 1 weeks  Home Health: n/a   Discharge Condition: STABLE   CODE STATUS: FULL DIET: 2 gram sodium restricted    Brief Hospitalization Summary: Please see all hospital notes, images, labs for full details of the hospitalization. ADMISSION PROVIDER HPI:   42 year old gentleman with reported history of opioid addiction (reported by stepmother) presented to emergency department by EMS secondary to presumed opioid overdose.  He apparently was found apneic by family member who called EMS.  They feared that he had expired.  He was given 2 mg of naloxone by family members prior to EMS arrival but he remained unresponsive.  When EMS arrived they gave an additional 2 mg.  Patient was notably breathing but unresponsive and initially placed on a nonrebreather and brought to the emergency department.  Patient was only responsive to loud verbal stimuli when he was assessed in the ED.  He denied alcohol and recreational drug use.  He was given an additional treatment of naloxone in the emergency department with some improvement in his responsiveness.  His chest x-ray demonstrated findings of multifocal opacities consistent with aspiration pneumonia and he was started on antibiotics.  His labs otherwise were stable.  His urine drug screen is still pending.  He is being admitted for acute respiratory failure with hypoxia and opioid overdose.   HOSPITAL COURSE BY PROBLEM   Assessment and Plan: Acute respiratory failure with hypoxia - RESOLVED  -Secondary to opioid overdose and aspiration pneumonia -Treated supportively -Weaned oxygen to room air  -IV antibiotics for pneumonia - transitioned to oral augmentin at discharge   Question of Opioid overdose vs  amphetamine overdose - UDS neg for opioid but he responded to multiple doses of naloxone - UDS positive for amphetamines - Pt denies ever using recreational drugs -aspiration precautions -supportive measures ordered  -TTS eval requested for today as he is medically cleared 11/22/22.  -Pt is medically cleared on 11/22/22 for TTS evaluation  -TTS has psych cleared patient on 11/22/22.    Aspiration pneumonia  -treated with IV antibiotics with plan to transition to oral augmentin  -follow blood culture - no growth to date  -supportive measures as ordered -weaned to room air  -complete oral steroid last dose given 5/24    Elevated BPs/HTN - DC IV fluids  - adding metoprolol 25 mg BID and amlodipine 5 mg  - IV prn meds available as needed  - BP likely elevated some from steroid which will stop today  - 2 gram low sodium diet recommended    DVT prophylaxis: enoxaparin Code Status: Full  Family Communication:  Disposition: DC HOME AS HE IS NOW psych cleared by TTS   Pt is medically cleared now for TTS evaluation    Consultants:  TTS  Procedures:    Antimicrobials:  Ampicillin sulbactam 5/22>>   Discharge Diagnoses:  Principal Problem:   Acute respiratory failure with hypoxia (HCC) Active Problems:   Opioid overdose (HCC)   Aspiration pneumonia (HCC)   Recreational drug use   Discharge Instructions:  Allergies as of 11/22/2022       Reactions   Mobic [meloxicam] Other (See Comments)   REACTION: INTERACTION WITH BLOOD PRESSURE LEVELS.    Naproxen Other (See Comments)   REACTION: INTERACTION WITH BLOOD PRESSURE LEVELS  Medication List     TAKE these medications    amLODipine 5 MG tablet Commonly known as: NORVASC Take 1 tablet (5 mg total) by mouth daily. Start taking on: Nov 23, 2022   amoxicillin-clavulanate 875-125 MG tablet Commonly known as: AUGMENTIN Take 1 tablet by mouth 2 (two) times daily for 3 days.   metoprolol tartrate 25 MG  tablet Commonly known as: LOPRESSOR Take 1 tablet (25 mg total) by mouth 2 (two) times daily.        Follow-up Information     [facility]. Call.   Why: They will set you up with an doctors appointment and see if you qualify for any programs or insurance assistance. Contact information: [phone]               Allergies  Allergen Reactions   Mobic [Meloxicam] Other (See Comments)    REACTION: INTERACTION WITH BLOOD PRESSURE LEVELS.    Naproxen Other (See Comments)    REACTION: INTERACTION WITH BLOOD PRESSURE LEVELS   Allergies as of 11/22/2022       Reactions   Mobic [meloxicam] Other (See Comments)   REACTION: INTERACTION WITH BLOOD PRESSURE LEVELS.    Naproxen Other (See Comments)   REACTION: INTERACTION WITH BLOOD PRESSURE LEVELS        Medication List     TAKE these medications    amLODipine 5 MG tablet Commonly known as: NORVASC Take 1 tablet (5 mg total) by mouth daily. Start taking on: Nov 23, 2022   amoxicillin-clavulanate 875-125 MG tablet Commonly known as: AUGMENTIN Take 1 tablet by mouth 2 (two) times daily for 3 days.   metoprolol tartrate 25 MG tablet Commonly known as: LOPRESSOR Take 1 tablet (25 mg total) by mouth 2 (two) times daily.        Procedures/Studies: DG Chest Portable 1 View  Result Date: 11/20/2022 CLINICAL DATA:  Hypoxia.  Overdose. EXAM: PORTABLE CHEST 1 VIEW COMPARISON:  10/04/2008 FINDINGS: Diffuse airspace disease in the right lung is associated with more modest patchy airspace disease in the left mid lung and left base. The cardiopericardial silhouette is within normal limits for size. Stomach is distended with gas. No acute bony abnormality. Telemetry leads overlie the chest. IMPRESSION: Diffuse airspace disease in the right lung with more modest patchy airspace disease in the left mid lung and left base. Features could reflect aspiration, asymmetric edema, or diffuse infection.  Electronically Signed   By: Kennith Center M.D.   On: 11/20/2022 05:43     Subjective: Pt insists on going home.  Pt reports that he feels better, he spoke with TTS and psych cleared.  Discharge Exam: Vitals:   11/22/22 1228 11/22/22 1312  BP: (!) 166/89 (!) 143/93  Pulse:  74  Resp:  20  Temp:  98.7 F (37.1 C)  SpO2:  98%   Vitals:   11/22/22 1007 11/22/22 1202 11/22/22 1228 11/22/22 1312  BP: (!) 183/100  (!) 166/89 (!) 143/93  Pulse: 86   74  Resp:    20  Temp:  98.5 F (36.9 C)  98.7 F (37.1 C)  TempSrc:  Oral  Oral  SpO2:    98%   General: Pt is alert, awake, not in acute distress Cardiovascular: normal  S1/S2 +, no rubs, no gallops Respiratory: CTA bilaterally, no wheezing, no rhonchi Abdominal: Soft, NT, ND, bowel sounds + Extremities: no edema, no cyanosis   The results of significant diagnostics from this hospitalization (including imaging, microbiology, ancillary  and laboratory) are listed below for reference.     Microbiology: Recent Results (from the past 240 hour(s))  Culture, blood (single)     Status: None (Preliminary result)   Collection Time: 11/20/22  6:10 AM   Specimen: Right Antecubital; Blood  Result Value Ref Range Status   Specimen Description RIGHT ANTECUBITAL  Final   Special Requests   Final    BOTTLES DRAWN AEROBIC AND ANAEROBIC Blood Culture results may not be optimal due to an excessive volume of blood received in culture bottles   Culture   Final    NO GROWTH 2 DAYS Performed at [facility], [address], [place], [place] [address]    Report Status PENDING  Incomplete  MRSA Next Gen by PCR, Nasal     Status: Abnormal   Collection Time: 11/20/22  7:48 AM   Specimen: Nasal Mucosa; Nasal Swab  Result Value Ref Range Status   MRSA by PCR Next Gen DETECTED (A) NOT DETECTED Final    Comment: RESULT CALLED TO, READ BACK BY AND VERIFIED WITH: DANNY GREBER @ 1648 ON 11/20/22 C VARNER (NOTE) The GeneXpert MRSA Assay (FDA approved for  NASAL specimens only), is one component of a comprehensive MRSA colonization surveillance program. It is not intended to diagnose MRSA infection nor to guide or monitor treatment for MRSA infections. Test performance is not FDA approved in patients less than 77 years old. Performed at [facility], [address], [place], [place] [address]      Labs: BNP (last 3 results) Recent Labs    11/20/22 0510  BNP 32.0   Basic Metabolic Panel: Recent Labs  Lab 11/20/22 0510 11/21/22 0433 11/22/22 0411  NA 137 138 137  K 4.3 4.0 3.8  CL 104 107 108  CO2 25 24 24   GLUCOSE 99 141* 94  BUN 19 13 14   CREATININE 1.23 0.85 0.94  CALCIUM 8.2* 8.1* 8.2*  MG  --  2.1  --    Liver Function Tests: Recent Labs  Lab 11/20/22 0510 11/21/22 0433 11/22/22 0411  AST 81* 25 16  ALT 62* 37 27  ALKPHOS 75 50 45  BILITOT 0.7 1.1 0.8  PROT 6.9 6.1* 5.8*  ALBUMIN 3.8 2.8* 2.8*   No results for input(s): "LIPASE", "AMYLASE" in the last 168 hours. No results for input(s): "AMMONIA" in the last 168 hours. CBC: Recent Labs  Lab 11/20/22 0510 11/21/22 0433  WBC 6.9 13.3*  NEUTROABS 5.7 10.0*  HGB 13.7 11.0*  HCT 42.7 34.1*  MCV 91.0 92.2  PLT 222 164   Cardiac Enzymes: No results for input(s): "CKTOTAL", "CKMB", "CKMBINDEX", "TROPONINI" in the last 168 hours. BNP: Invalid input(s): "POCBNP" CBG: Recent Labs  Lab 11/20/22 0232  GLUCAP 208*   D-Dimer No results for input(s): "DDIMER" in the last 72 hours. Hgb A1c Recent Labs    11/20/22 0230  HGBA1C 5.6   Lipid Profile No results for input(s): "CHOL", "HDL", "LDLCALC", "TRIG", "CHOLHDL", "LDLDIRECT" in the last 72 hours. Thyroid function studies No results for input(s): "TSH", "T4TOTAL", "T3FREE", "THYROIDAB" in the last 72 hours.  Invalid input(s): "FREET3" Anemia work up No results for input(s): "VITAMINB12", "FOLATE", "FERRITIN", "TIBC", "IRON", "RETICCTPCT" in the last 72 hours. Urinalysis    Component Value  Date/Time   COLORURINE YELLOW 11/20/2022 1330   APPEARANCEUR HAZY (A) 11/20/2022 1330   LABSPEC 1.016 11/20/2022 1330   PHURINE 5.0 11/20/2022 1330   GLUCOSEU NEGATIVE 11/20/2022 1330   HGBUR NEGATIVE 11/20/2022 1330   BILIRUBINUR  NEGATIVE 11/20/2022 1330   KETONESUR NEGATIVE 11/20/2022 1330   PROTEINUR 30 (A) 11/20/2022 1330   NITRITE NEGATIVE 11/20/2022 1330   LEUKOCYTESUR NEGATIVE 11/20/2022 1330   Sepsis Labs Recent Labs  Lab 11/20/22 0510 11/21/22 0433  WBC 6.9 13.3*   Microbiology Recent Results (from the past 240 hour(s))  Culture, blood (single)     Status: None (Preliminary result)   Collection Time: 11/20/22  6:10 AM   Specimen: Right Antecubital; Blood  Result Value Ref Range Status   Specimen Description RIGHT ANTECUBITAL  Final   Special Requests   Final    BOTTLES DRAWN AEROBIC AND ANAEROBIC Blood Culture results may not be optimal due to an excessive volume of blood received in culture bottles   Culture   Final    NO GROWTH 2 DAYS Performed at [facility], [address] [place], [place] [address]    Report Status PENDING  Incomplete  MRSA Next Gen by PCR, Nasal     Status: Abnormal   Collection Time: 11/20/22  7:48 AM   Specimen: Nasal Mucosa; Nasal Swab  Result Value Ref Range Status   MRSA by PCR Next Gen DETECTED (A) NOT DETECTED Final    Comment: RESULT CALLED TO, READ BACK BY AND VERIFIED WITH: DANNY GREBER @ 1648 ON 11/20/22 C VARNER (NOTE) The GeneXpert MRSA Assay (FDA approved for NASAL specimens only), is one component of a comprehensive MRSA colonization surveillance program. It is not intended to diagnose MRSA infection nor to guide or monitor treatment for MRSA infections. Test performance is not FDA approved in patients less than 107 years old. Performed at [facility], [address], [place], [place] [address]    Time coordinating discharge:   SIGNED:  Standley Dakins, MD  [facility] 11/22/2022, 4:01 PM How to contact the  [facility] Attending or Consulting provider 7A - 7P or covering provider during after hours 7P -7A, for this patient?  Check the care team in [facility] and look for a) attending/consulting TRH provider listed and b) the [facility] team listed Log into [email or web address] and use [place]'s universal password to access. If you do not have the password, please contact the hospital operator. Locate the [facility] provider you are looking for under [facility] and page to a number that you can be directly reached. If you still have difficulty reaching the provider, please page the [facility] (Director on Call) for the Hospitalists listed on amion for assistance.

## 2022-11-22 NOTE — Progress Notes (Addendum)
PROGRESS NOTE   Alexander Morgan  ZOX:[id number] DOB: 1980/10/04 DOA: 11/20/2022 PCP: Pcp, No   Chief Complaint  Patient presents with   Drug Overdose   Level of care: Med-Surg  Brief Admission History:  42 year old gentleman with reported history of opioid addiction (reported by stepmother) presented to emergency department by EMS secondary to presumed opioid overdose.  He apparently was found apneic by family member who called EMS.  They feared that he had expired.  He was given 2 mg of naloxone by family members prior to EMS arrival but he remained unresponsive.  When EMS arrived they gave an additional 2 mg.  Patient was notably breathing but unresponsive and initially placed on a nonrebreather and brought to the emergency department.  Patient was only responsive to loud verbal stimuli when he was assessed in the ED.  He denied alcohol and recreational drug use.  He was given an additional treatment of naloxone in the emergency department with some improvement in his responsiveness.  His chest x-ray demonstrated findings of multifocal opacities consistent with aspiration pneumonia and he was started on antibiotics.  His labs otherwise were stable.  His urine drug screen is still pending.  He is being admitted for acute respiratory failure with hypoxia and opioid overdose.  Pt has been medically cleared on 11/22/22.     Assessment and Plan: Acute respiratory failure with hypoxia - RESOLVED  -Secondary to opioid overdose and aspiration pneumonia -Treated supportively -Weaned oxygen to room air  -IV antibiotics for pneumonia - transition to oral augmentin at discharge   Question of Opioid overdose vs amphetamine overdose - UDS neg for opioid but he responded to multiple doses of naloxone - UDS positive for amphetamines - Pt denies ever using recreational drugs -aspiration precautions -supportive measures ordered  -TTS eval requested for today as he is medically cleared 11/22/22.  -Pt is  medically cleared on 11/22/22 for TTS evaluation    Aspiration pneumonia  -treated with IV antibiotics with plan to transition to oral augmentin  -follow blood culture - no growth to date  -supportive measures as ordered -weaned to room air  -complete oral steroid  Elevated BPs/HTN - DC IV fluids  - adding metoprolol 25 mg BID and amlodipine 5 mg  - IV prn meds available as needed  - BP likely elevated some from steroid which will stop today   DVT prophylaxis: enoxaparin Code Status: Full  Family Communication:  Disposition: anticipate home later today if cleared by TTS  Pt is medically cleared now for TTS evaluation    Consultants:  TTS  Procedures:   Antimicrobials:  Ampicillin sulbactam 5/22>>   Subjective: Pt is eating and drinking and cooperating with staff.  Says he has no PCP. Says he has history of HTN but has been off meds for a while.   Objective: Vitals:   11/22/22 0756 11/22/22 0804 11/22/22 0900 11/22/22 1007  BP:  (!) 178/100 (!) 183/100 (!) 183/100  Pulse:  71 97 86  Resp:  (!) 25 20   Temp: (!) 97.5 F (36.4 C)     TempSrc: Oral     SpO2:  100% 98%     Intake/Output Summary (Last 24 hours) at 11/22/2022 1035 Last data filed at 11/22/2022 8119 Gross per 24 hour  Intake 3042.01 ml  Output 1850 ml  Net 1192.01 ml   There were no vitals filed for this visit. Examination:  General exam: awake, alert, cooperative, NAD.   Respiratory system: Clear to auscultation. Respiratory  effort normal. Cardiovascular system: normal S1 & S2 heard. No JVD, murmurs, rubs, gallops or clicks. No pedal edema. Gastrointestinal system: Abdomen is nondistended, soft and nontender. No organomegaly or masses felt. Normal bowel sounds heard. Central nervous system: Alert and oriented. No focal neurological deficits. Extremities: Symmetric 5 x 5 power. Skin: No rashes, lesions or ulcers. Psychiatry: Judgement and insight appear UTD. Mood & affect flat.   Data Reviewed: I  have personally reviewed following labs and imaging studies  CBC: Recent Labs  Lab 11/20/22 0510 11/21/22 0433  WBC 6.9 13.3*  NEUTROABS 5.7 10.0*  HGB 13.7 11.0*  HCT 42.7 34.1*  MCV 91.0 92.2  PLT 222 164    Basic Metabolic Panel: Recent Labs  Lab 11/20/22 0510 11/21/22 0433 11/22/22 0411  NA 137 138 137  K 4.3 4.0 3.8  CL 104 107 108  CO2 25 24 24   GLUCOSE 99 141* 94  BUN 19 13 14   CREATININE 1.23 0.85 0.94  CALCIUM 8.2* 8.1* 8.2*  MG  --  2.1  --     CBG: Recent Labs  Lab 11/20/22 0232  GLUCAP 208*    Recent Results (from the past 240 hour(s))  Culture, blood (single)     Status: None (Preliminary result)   Collection Time: 11/20/22  6:10 AM   Specimen: Right Antecubital; Blood  Result Value Ref Range Status   Specimen Description RIGHT ANTECUBITAL  Final   Special Requests   Final    BOTTLES DRAWN AEROBIC AND ANAEROBIC Blood Culture results may not be optimal due to an excessive volume of blood received in culture bottles   Culture   Final    NO GROWTH 2 DAYS Performed at [facility], [address] [place], [place] [address]    Report Status PENDING  Incomplete  MRSA Next Gen by PCR, Nasal     Status: Abnormal   Collection Time: 11/20/22  7:48 AM   Specimen: Nasal Mucosa; Nasal Swab  Result Value Ref Range Status   MRSA by PCR Next Gen DETECTED (A) NOT DETECTED Final    Comment: RESULT CALLED TO, READ BACK BY AND VERIFIED WITH: DANNY GREBER @ 1648 ON 11/20/22 C VARNER (NOTE) The GeneXpert MRSA Assay (FDA approved for NASAL specimens only), is one component of a comprehensive MRSA colonization surveillance program. It is not intended to diagnose MRSA infection nor to guide or monitor treatment for MRSA infections. Test performance is not FDA approved in patients less than 75 years old. Performed at [facility], [address], [place], [place] [address]      Radiology Studies: No results found.  Scheduled Meds:  amLODipine  5 mg Oral Daily    Chlorhexidine Gluconate Cloth  6 each Topical Q0600   enoxaparin (LOVENOX) injection  40 mg Subcutaneous Q24H   ipratropium-albuterol  3 mL Nebulization BID   metoprolol tartrate  25 mg Oral BID   mupirocin ointment  1 Application Nasal BID   pantoprazole  40 mg Oral Q0600   predniSONE  40 mg Oral Q breakfast   Continuous Infusions:  sodium chloride 10 mL/hr at 11/22/22 0912   ampicillin-sulbactam (UNASYN) IV 3 g (11/22/22 0981)    LOS: 2 days   Time spent: 35 mins  Ellington Cornia Laural Benes, MD How to contact the [facility] Attending or Consulting provider 7A - 7P or covering provider during after hours 7P -7A, for this patient?  Check the care team in [facility] and look for a) attending/consulting TRH provider listed and b) the  TRH team listed Log into [email or web address] and use [place]'s universal password to access. If you do not have the password, please contact the hospital operator. Locate the [facility] provider you are looking for under [facility] and page to a number that you can be directly reached. If you still have difficulty reaching the provider, please page the [facility] (Director on Call) for the Hospitalists listed on amion for assistance.  11/22/2022, 10:35 AM

## 2022-11-22 NOTE — TOC Progression Note (Signed)
Transition of Care [facility]) - Progression Note    Patient Details  Name: Alexander Morgan MRN: [id number] Date of Birth: 08-21-1980  Transition of [facility]) CM/SW Contact  Leitha Bleak, RN Phone Number: 11/22/2022, 3:36 PM  Clinical Narrative:   Patient waiting on TTS assessment, He is ready to leave. Patient has no PCP and no Insurance. Patient lives in [place].  CM called [employer] to give them the referral. Added to AVS for patient.       Barriers to Discharge:  (Needs TTS)

## 2022-11-22 NOTE — Progress Notes (Signed)
Report called and given to [place], LPN on dept. 161. Pt to be wheeled to room 338 via WC.

## 2022-11-22 NOTE — BH Assessment (Signed)
Comprehensive Clinical Assessment (CCA) Note  11/22/2022 CARLISLE PASZKIEWICZ [id number] DISPOSITION: Freida Busman NP reported that patient does not meet inpatient criteria as patient has been cleared by psychiatry.    The patient demonstrates the following risk factors for suicide: Chronic risk factors for suicide include: N/A. Acute risk factors for suicide include: N/A. Protective factors for this patient include: coping skills. Considering these factors, the overall suicide risk at this point appears to be low. Patient is appropriate for outpatient follow up.   Patient is a 42 year old male that presented voluntary by EMS to [facility] after he was found unresponsive by a family member after an suspected overdose. Patient denies any S/I, H/I or AVH this date. Patient denies any previous attempts or gestures at self harm. Patient denies any current/prior mental health disorder or any mental health symptoms. Patient is reluctant to discuss the events that transpired on 5/22 when he was found unresponsive. Patient denies any SA use or history of using any substances. Patient was questioned in reference to admission note when stepmother found patient unresponsive who reported at that time, patient had a history of opiate addiction which patient denies at the time of assessment. Patient has a history of using multiple substances per chart review where he had previously admitted to using substances although denies any SA history this date. Patient's UDS was positive for amphetamines this date and when confronted with results patient stated, "I have no idea what happened with that." Patient states he currently resides alone and declines to offer any more information associated with assessment.   Patient was asked if he would like any SA resources on discharge with patient stating, "I don't use any drugs," I just want to go home. Patient rendered limited history and shook his head, "No" to all assessment questions. Patient made  limited eye contact and appeared to be agitated. Patient was observed to be getting out of his bed and was asking for all his personal belongings as he went out of sight of camera terminating the interview.  Patient was alert and oriented x 4. Patient spoke in a clear voice with normal pace and volume. Patient was observed to be agitated rendering limited history and declined to participate in most of the assessment. Patient's memory appears to be intact with thoughts organized. Patient's mood is irritable with affect congruent. Patient does not appear to be responding to internal stimuli.    Chief Complaint:  Chief Complaint  Patient presents with   Drug Overdose   Visit Diagnosis: Substance induced mood disorder     CCA Screening, Triage and Referral (STR)  Patient Reported Information How did you hear about [place]? Self  What Is the Reason for Your Visit/Call Today? 42 year old gentleman with reported history of opioid addiction (reported by stepmother) presented to emergency department by EMS secondary to presumed opioid overdose.  He apparently was found apneic by family member who called EMS.  They feared that he had expired.  He was given 2 mg of naloxone by family members prior to EMS arrival but he remained unresponsive.  When EMS arrived they gave an additional 2 mg.  Patient was notably breathing but unresponsive and initially placed on a nonrebreather and brought to the emergency department.  Patient was only responsive to loud verbal stimuli when he was assessed in the ED.  He denied alcohol and recreational drug use.  He was given an additional treatment of naloxone in the emergency department with some improvement in his responsiveness.  His chest x-ray demonstrated findings of multifocal opacities consistent with aspiration pneumonia and he was started on antibiotics.  His labs otherwise were stable.  His urine drug screen is still pending.  He is being admitted for acute respiratory  failure with hypoxia and opioid overdose.  How Long Has This Been Causing You Problems? <Week  What Do You Feel Would Help You the Most Today? -- (Patient declines all resources and reportds he doesn't need any help)   Have You Recently Had Any Thoughts About Hurting Yourself? No  Are You Planning to Commit Suicide/Harm Yourself At This time? No   Flowsheet Row ED to Hosp-Admission (Current) from 11/20/2022 in [facility] ED from 10/26/2022 in [facility] Department at [facility] ED from 02/02/2022 in [facility] Emergency Department at [facility]  C-SSRS RISK CATEGORY No Risk No Risk No Risk       Have you Recently Had Thoughts About Hurting Someone Karolee Ohs? No  Are You Planning to Harm Someone at This Time? No  Explanation: NA   Have You Used Any Alcohol or Drugs in the Past 24 Hours? No (Patient denies contrary to evidence and UDS)  What Did You Use and How Much? patient denies using any substances   Do You Currently Have a Therapist/Psychiatrist? No  Name of Therapist/Psychiatrist: Name of Therapist/Psychiatrist: NA   Have You Been Recently Discharged From Any Office Practice or Programs? No  Explanation of Discharge From Practice/Program: NA     CCA Screening Triage Referral Assessment Type of Contact: Tele-Assessment  Telemedicine Service Delivery: Telemedicine service delivery: This service was provided via telemedicine using a 2-way, interactive audio and video technology  Is this Initial or Reassessment? Is this Initial or Reassessment?: Initial Assessment  Date Telepsych consult ordered in [facility]:  Date Telepsych consult ordered in [facility]: 11/22/22  Time Telepsych consult ordered in [facility]:  Time Telepsych consult ordered in [facility]: 1000  Location of Assessment: [facility] Medical Floor  Provider Location: [facility] Assessment Services   Collateral Involvement: None at this time   Does Patient Have a Automotive engineer  Guardian? No  Legal Guardian Contact Information: NA  Copy of Legal Guardianship Form: -- (NA)  Legal Guardian Notified of Arrival: -- (NA)  Legal Guardian Notified of Pending Discharge: -- (NA)  If Minor and Not Living with Parent(s), Who has Custody? NA  Is CPS involved or ever been involved? Never  Is APS involved or ever been involved? Never   Patient Determined To Be At Risk for Harm To Self or Others Based on Review of Patient Reported Information or Presenting Complaint? No  Method: No Plan  Availability of Means: No access or NA  Intent: Vague intent or NA  Notification Required: No need or identified person  Additional Information for Danger to Others Potential: -- (NA)  Additional Comments for Danger to Others Potential: NA  Are There Guns or Other Weapons in Your Home? No  Types of Guns/Weapons: NA  Are These Weapons Safely Secured?                            -- (NA)  Who Could Verify You Are Able To Have These Secured: NA  Do You Have any Outstanding Charges, Pending Court Dates, Parole/Probation? Patient states he is on parole  Contacted To Inform of Risk of Harm To Self or Others: Other: Comment (NA)    Does Patient Present  under Involuntary Commitment? No    [place] of Residence: [place]   Patient Currently Receiving the Following Services: Not Receiving Services   Determination of Need: Routine (7 days)   Options For Referral: Other: Comment (patient declines needing any assistance)     CCA Biopsychosocial Patient Reported Schizophrenia/Schizoaffective Diagnosis in Past: No   Strengths: UTA patient declines to participate in assessment   Mental Health Symptoms Depression:   None   Duration of Depressive symptoms:    Mania:   None   Anxiety:    None   Psychosis:   None   Duration of Psychotic symptoms:    Trauma:   None   Obsessions:   None   Compulsions:   None   Inattention:   None    Hyperactivity/Impulsivity:   None   Oppositional/Defiant Behaviors:   None   Emotional Irregularity:   None   Other Mood/Personality Symptoms:   NA    Mental Status Exam Appearance and self-care  Stature:   Average   Weight:   Average weight   Clothing:   Disheveled   Grooming:   Normal   Cosmetic use:   None   Posture/gait:   Normal   Motor activity:   Not Remarkable   Sensorium  Attention:   Normal   Concentration:   Normal   Orientation:   X5   Recall/memory:   Normal   Affect and Mood  Affect:   Anxious   Mood:   Anxious   Relating  Eye contact:   Normal   Facial expression:   Anxious   Attitude toward examiner:   Defensive   Thought and Language  Speech flow:  Clear and Coherent   Thought content:   Appropriate to Mood and Circumstances   Preoccupation:   None   Hallucinations:   None   Organization:   Intact   [employer] of Knowledge:   Fair   Intelligence:   Average   Abstraction:   Normal   Judgement:   Poor   Reality Testing:   Realistic   Insight:   Denial   Decision Making:   Normal   Social Functioning  Social Maturity:   Responsible   Social Judgement:   Normal   Stress  Stressors:   Other (Comment) (Pt denies having any current stressors)   Coping Ability:   Normal   Skill Deficits:   None   Supports:   Friends/Service system     Religion: Religion/Spirituality Are You A Religious Person?: No How Might This Affect Treatment?: NA  Leisure/Recreation: Leisure / Recreation Do You Have Hobbies?: No  Exercise/Diet: Exercise/Diet Do You Exercise?: No Have You Gained or Lost A Significant Amount of Weight in the Past Six Months?: No Do You Follow a Special Diet?: No Do You Have Any Trouble Sleeping?: No   CCA Employment/Education Employment/Work Situation: Employment / Work Situation Employment Situation: Unemployed Patient's Job has Been Impacted  by Current Illness: No Has Patient ever Been in Equities trader?: No  Education: Education Is Patient Currently Attending School?: No Last Grade Completed: 12 Did You Product manager?: No Did You Have An Individualized Education Program (IIEP): No Did You Have Any Difficulty At [employer]?: No Patient's Education Has Been Impacted by Current Illness: No   CCA Family/Childhood History Family and Relationship History: Family history Marital status: Single Does patient have children?: Yes How many children?: 5 How is patient's relationship with their children?: Pt reports none of his children resides  with him, patient refuses to answer any other questions associated with assessment  Childhood History:  Childhood History By whom was/is the patient raised?: Both parents Did patient suffer any verbal/emotional/physical/sexual abuse as a child?: No Did patient suffer from severe childhood neglect?: No Has patient ever been sexually abused/assaulted/raped as an adolescent or adult?: No Was the patient ever a victim of a crime or a disaster?: No Witnessed domestic violence?: No Has patient been affected by domestic violence as an adult?: No       CCA Substance Use Alcohol/Drug Use: Alcohol / Drug Use Pain Medications: See MAR Prescriptions: See MAR Over the Counter: See MAR History of alcohol / drug use?: No history of alcohol / drug abuse (Pt denies any hx of SA issues although patient had overdosed prior to arrival and tested positive for amphetamines this date) Longest period of sobriety (when/how long):  (NA) Negative Consequences of Use:  (NA) Withdrawal Symptoms:  (NA)                         ASAM's:  Six Dimensions of Multidimensional Assessment  Dimension 1:  Acute Intoxication and/or Withdrawal Potential:   Dimension 1:  Description of individual's past and current experiences of substance use and withdrawal: NA  Dimension 2:  Biomedical Conditions and  Complications:   Dimension 2:  Description of patient's biomedical conditions and  complications: NA  Dimension 3:  Emotional, Behavioral, or Cognitive Conditions and Complications:  Dimension 3:  Description of emotional, behavioral, or cognitive conditions and complications: NA  Dimension 4:  Readiness to Change:  Dimension 4:  Description of Readiness to Change criteria: NA  Dimension 5:  Relapse, Continued use, or Continued Problem Potential:  Dimension 5:  Relapse, continued use, or continued problem potential critiera description: NA  Dimension 6:  Recovery/Living Environment:  Dimension 6:  Recovery/Iiving environment criteria description: NA  ASAM Severity Score:    ASAM Recommended Level of Treatment: ASAM Recommended Level of Treatment:  (NA)   Substance use Disorder (SUD) Substance Use Disorder (SUD)  Checklist Symptoms of Substance Use:  (NA)  Recommendations for Services/Supports/Treatments: Recommendations for Services/Supports/Treatments Recommendations For Services/Supports/Treatments:  (NA)  Discharge Disposition:    DSM5 Diagnoses: Patient Active Problem List   Diagnosis Date Noted   Acute respiratory failure with hypoxia (HCC) 11/20/2022   Opioid overdose (HCC) 11/20/2022   Aspiration pneumonia (HCC) 11/20/2022   Recreational drug use 11/20/2022     Referrals to Alternative Service(s): Referred to Alternative Service(s):   Place:   Date:   Time:    Referred to Alternative Service(s):   Place:   Date:   Time:    Referred to Alternative Service(s):   Place:   Date:   Time:    Referred to Alternative Service(s):   Place:   Date:   Time:     Alfredia Ferguson, LCAS

## 2022-11-26 LAB — CULTURE, BLOOD (SINGLE): Culture: NO GROWTH

## 2023-01-04 IMAGING — CR DG ANKLE COMPLETE 3+V*L*
1 series · 3 of 3 positions shown · non-contrast
Comparison: None Available.

CLINICAL DATA: Left ankle twisting injury.

EXAM:
LEFT ANKLE COMPLETE - 3+ VIEW

[Series 1: dg ankle complete left · 0.14mm/px · 3 of 3 slices shown]
[im 1/3]
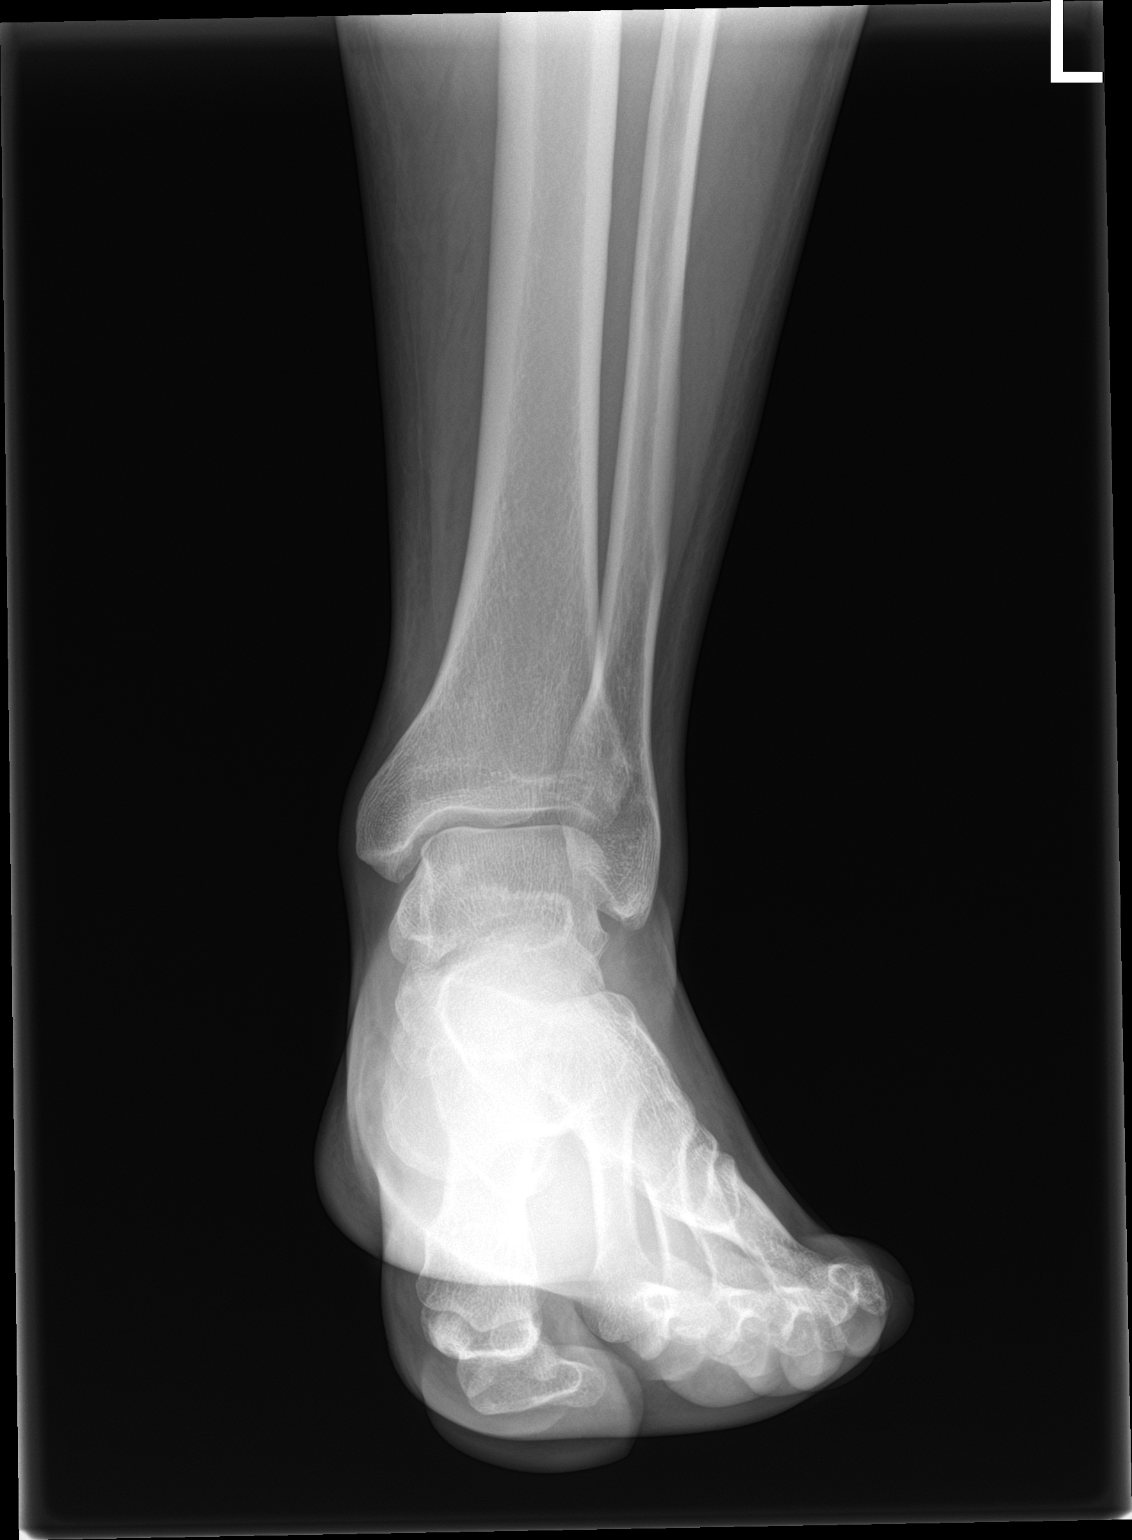
[im 2/3]
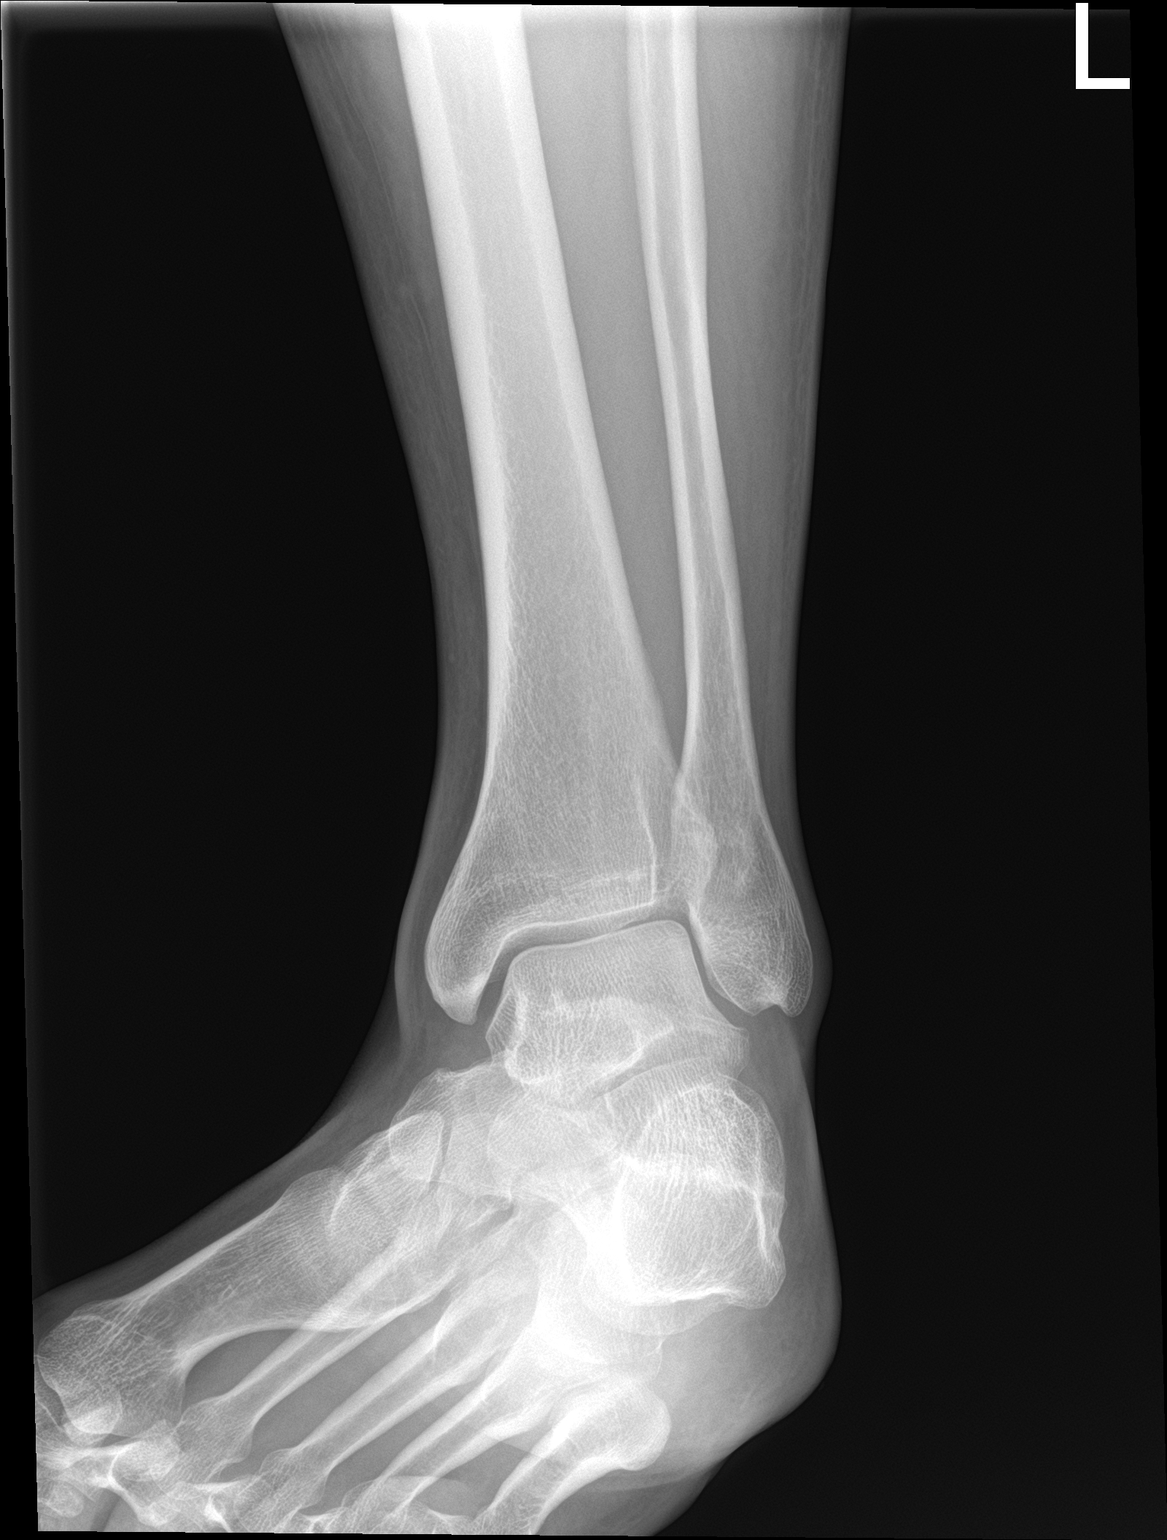
[im 3/3]
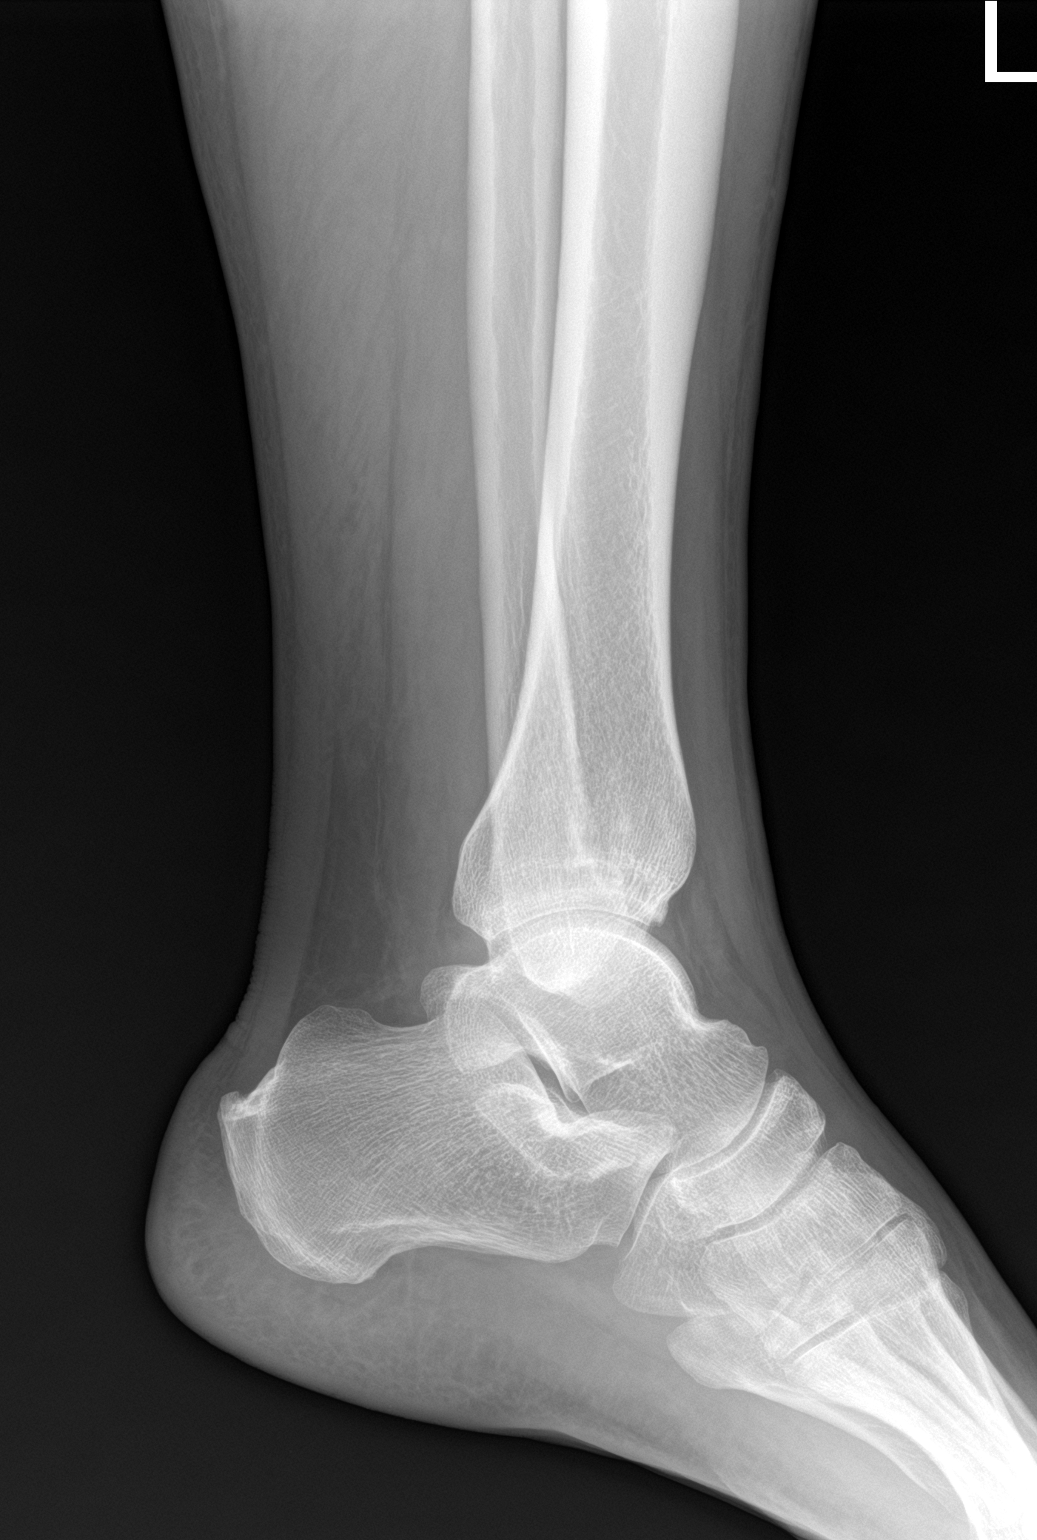

[3 of 3 positions shown; findings below may reference images not displayed]

FINDINGS: There is no evidence of fracture, dislocation, or joint effusion.
There is no evidence of arthropathy or other focal bone abnormality.
Soft tissues are unremarkable.
IMPRESSION: Negative.
